# Patient Record
Sex: Male | Born: 1970
Health system: Southern US, Community
[De-identification: ages and names within clinical notes are randomized; demographics above are authoritative.]

## PROBLEM LIST (undated history)

## (undated) DIAGNOSIS — M2550 Pain in unspecified joint: Secondary | ICD-10-CM

## (undated) DIAGNOSIS — M1990 Unspecified osteoarthritis, unspecified site: Secondary | ICD-10-CM

## (undated) DIAGNOSIS — G4733 Obstructive sleep apnea (adult) (pediatric): Secondary | ICD-10-CM

## (undated) DIAGNOSIS — Z973 Presence of spectacles and contact lenses: Secondary | ICD-10-CM

## (undated) HISTORY — DX: Presence of spectacles and contact lenses: Z97.3

## (undated) HISTORY — PX: BUNIONECTOMY: SHX129

## (undated) HISTORY — DX: Pain in unspecified joint: M25.50

## (undated) HISTORY — PX: ROTATOR CUFF REPAIR: SHX139

## (undated) HISTORY — DX: Unspecified osteoarthritis, unspecified site: M19.90

## (undated) HISTORY — DX: Obstructive sleep apnea (adult) (pediatric): G47.33

---

## 1992-08-11 HISTORY — PX: TONSILLECTOMY: SHX5217

## 1998-05-21 ENCOUNTER — Other Ambulatory Visit: Admission: RE | Admit: 1998-05-21 | Discharge: 1998-05-21 | Payer: Self-pay | Admitting: Otolaryngology

## 1998-08-11 HISTORY — PX: NASAL SINUS SURGERY: SHX719

## 2004-12-24 ENCOUNTER — Ambulatory Visit (HOSPITAL_BASED_OUTPATIENT_CLINIC_OR_DEPARTMENT_OTHER): Admission: RE | Admit: 2004-12-24 | Discharge: 2004-12-24 | Payer: Self-pay | Admitting: Otolaryngology

## 2004-12-24 ENCOUNTER — Encounter: Payer: Self-pay | Admitting: Pulmonary Disease

## 2004-12-30 ENCOUNTER — Ambulatory Visit: Payer: Self-pay | Admitting: Internal Medicine

## 2009-04-24 ENCOUNTER — Encounter: Payer: Self-pay | Admitting: Pulmonary Disease

## 2009-05-28 ENCOUNTER — Ambulatory Visit: Payer: Self-pay | Admitting: Pulmonary Disease

## 2009-05-28 DIAGNOSIS — G4733 Obstructive sleep apnea (adult) (pediatric): Secondary | ICD-10-CM | POA: Insufficient documentation

## 2009-05-28 DIAGNOSIS — J309 Allergic rhinitis, unspecified: Secondary | ICD-10-CM | POA: Insufficient documentation

## 2009-06-25 ENCOUNTER — Ambulatory Visit: Payer: Self-pay | Admitting: Pulmonary Disease

## 2009-08-11 DIAGNOSIS — G4733 Obstructive sleep apnea (adult) (pediatric): Secondary | ICD-10-CM

## 2009-08-11 HISTORY — DX: Obstructive sleep apnea (adult) (pediatric): G47.33

## 2009-12-24 ENCOUNTER — Ambulatory Visit: Payer: Self-pay | Admitting: Pulmonary Disease

## 2010-02-02 ENCOUNTER — Encounter: Payer: Self-pay | Admitting: Pulmonary Disease

## 2010-09-02 ENCOUNTER — Encounter: Payer: Self-pay | Admitting: Family Medicine

## 2010-09-10 NOTE — Assessment & Plan Note (Signed)
Summary: rov for osa   Copy to:  Osborn Coho Primary Provider/Referring Provider:  Amil Amen- Dr. Lynelle Doctor.   CC:  Pt is here for a 6 month f/u appt.  Pt states he is wearing his cpap machine every night.  Approx 4 to 5 hours per night.  Pt states mask feels "loose" and c/o air leaks.  Pt also wonder if pressure may be "too soft." .  History of Present Illness: the pt comes in today for f/u of his know osa.  At the last visit he was supposed to have auto download with advanced to optimize his pressure, and we have not received a report and the pt is unsure if this was ever done.  He is still waking up during the night, but I have told him that his pressure may not be optimized.  He clearly sees a difference btw when he wears and when he doesn't.  He doesn't feel his pressure is adequate, and may be having issues with mask leak.   His weight is down 7 pounds since the last visit.  Current Medications (verified): 1)  Mobic 7.5 Mg Tabs (Meloxicam) .... Take 1 Tablet By Mouth Two Times A Day  Allergies (verified): 1)  ! Penicillin  Review of Systems      See HPI  Vital Signs:  Patient profile:   40 year old male Height:      73.5 inches Weight:      276 pounds BMI:     36.05 O2 Sat:      96 % on Room air Temp:     97.7 degrees F oral Pulse rate:   71 / minute BP sitting:   108 / 60  (left arm) Cuff size:   large  Vitals Entered By: Arman Filter LPN (Dec 24, 2009 2:54 PM)  O2 Flow:  Room air CC: Pt is here for a 6 month f/u appt.  Pt states he is wearing his cpap machine every night.  Approx 4 to 5 hours per night.  Pt states mask feels "loose" and c/o air leaks.  Pt also wonder if pressure may be "too soft."  Comments Medications reviewed with patient Arman Filter LPN  Dec 24, 2009 2:55 PM    Physical Exam  General:  obese male in nad Nose:  no skin breakdown or pressure necrosis from cpap mask Neurologic:  alert and oriented, moves all 4. not  sleepy.   Impression & Recommendations:  Problem # 1:  OBSTRUCTIVE SLEEP APNEA (ICD-327.23) the pt is doing well with cpap wrt compliance, and clearly sees a difference since wearing.  However, he is having some awakenings that are probably due to inadequate pressure.  He never had his pressure optimized.  At this point, will need to optimize pressure and also get dme to check on mask fit.  He also needs to work aggressively on weight loss.  Medications Added to Medication List This Visit: 1)  Mobic 7.5 Mg Tabs (Meloxicam) .... Take 1 tablet by mouth two times a day  Other Orders: Est. Patient Level III ([address]) DME Referral (DME)  Patient Instructions: 1)  will re-optimize your pressure, and let you know the results. 2)  work on weight loss 3)  followup with me in one year if doing well once we optimize your pressure, but sooner if having issues.   Immunization History:  Influenza Immunization History:    Influenza:  historical (05/11/2009)

## 2010-09-10 NOTE — Miscellaneous (Signed)
Summary: auto shows optimal pressure 10cm, but poor compliance  Clinical Lists Changes  Orders: Added new Referral order of DME Referral (DME) - Signed auto shows optimal pressure of 10cm, but very limited compliance. % used days greater than or equal to 4 hrs was only 33%

## 2010-12-20 ENCOUNTER — Encounter: Payer: Self-pay | Admitting: Pulmonary Disease

## 2010-12-24 ENCOUNTER — Ambulatory Visit: Payer: Self-pay | Admitting: Pulmonary Disease

## 2010-12-27 NOTE — Procedures (Signed)
Tony Waller, Tony Waller                  ACCOUNT NO.:  [id number]   MEDICAL RECORD NO.:  [id number]          PATIENT TYPE:  OUT   LOCATION:  SLEEP CENTER                 FACILITY:  [facility]   PHYSICIAN:  Clinton D. Maple Hudson, M.D. DATE OF BIRTH:  08-20-1970   DATE OF STUDY:  12/24/2004                              NOCTURNAL POLYSOMNOGRAM   REFERRING PHYSICIAN:  Dr. Osborn Coho   STUDY DATE:  Dec 24, 2004   INDICATION FOR STUDY:  Hypersomnia with sleep apnea.  Epworth Sleepiness  Score is 10/24, BMI 39, weight 300 pounds.   SLEEP ARCHITECTURE:  Total sleep time 312 minutes with sleep efficiency 74%.  Stage I was 10%, stage II 61%, stages III and IV 11%, REM was 18% of total  sleep time.  Sleep latency 53 minutes, REM latency 178 minutes, awake after  sleep onset 53 minutes, arousal index 34.   RESPIRATORY DATA:  Respiratory disturbance index (RDI, AHI) 63.7 obstructive  events per hour indicating severe obstructive sleep apnea/hypopnea syndrome.  There were 21 obstructive apneas, 1 mixed apnea and 310 hypopneas.  Events  were not positional.  REM RDI 63.  The technician could not use split  protocol for titration reporting that the patient did not have enough sleep  to meet allowed time window initially to permit time for subsequent CPAP  titration.   OXYGEN DATA:  Moderate to loud snoring with oxygen desaturation to a nadir  of 81% on room air.  Mean oxygen saturation through the study was 94% on  room air.   CARDIAC DATA:  Normal sinus rhythm with occasional PVC.   MOVEMENT/PARASOMNIA:  Occasional leg jerk with insignificant effect on  sleep.  Bathroom x1.   IMPRESSION/RECOMMENDATION:  1.  Severe obstructive sleep apnea/hypopnea syndrome, respiratory      disturbance index 63.7 per hour with loud snoring and oxygen      desaturation to 81%.  2.  Consider return for continuous positive airway pressure titration or      evaluate for alternative therapies as appropriate.      CDY/MEDQ  D:  12/29/2004 11:34:44  T:  12/29/2004 13:32:20  Job:  [id number]

## 2013-06-29 ENCOUNTER — Encounter: Payer: Self-pay | Admitting: Podiatry

## 2013-06-29 ENCOUNTER — Ambulatory Visit (INDEPENDENT_AMBULATORY_CARE_PROVIDER_SITE_OTHER): Payer: 59

## 2013-06-29 ENCOUNTER — Ambulatory Visit (INDEPENDENT_AMBULATORY_CARE_PROVIDER_SITE_OTHER): Payer: 59 | Admitting: Podiatry

## 2013-06-29 VITALS — BP 146/81 | HR 72 | Resp 16 | Ht 74.0 in | Wt 276.0 lb

## 2013-06-29 DIAGNOSIS — M21611 Bunion of right foot: Secondary | ICD-10-CM

## 2013-06-29 DIAGNOSIS — M2020 Hallux rigidus, unspecified foot: Secondary | ICD-10-CM

## 2013-06-29 DIAGNOSIS — M21619 Bunion of unspecified foot: Secondary | ICD-10-CM

## 2013-06-29 DIAGNOSIS — M2040 Other hammer toe(s) (acquired), unspecified foot: Secondary | ICD-10-CM

## 2013-06-29 DIAGNOSIS — M2021 Hallux rigidus, right foot: Secondary | ICD-10-CM

## 2013-06-29 DIAGNOSIS — L600 Ingrowing nail: Secondary | ICD-10-CM

## 2013-06-29 MED ORDER — OXYCODONE-ACETAMINOPHEN 10-325 MG PO TABS: 1.0000 | ORAL_TABLET | ORAL | Status: DC | PRN

## 2013-06-29 NOTE — Progress Notes (Signed)
Subjective:     Patient ID: Tony Waller, male   DOB: 11/18/1970, 42 y.o.   MRN: [id number]  Foot Pain   patient points to right foot stating the joint is starting to hurt me more and my toes are giving me trouble wearing shoe gear. Patient states the left foot is doing fine and also states the right hallux has an ingrown toenail which is becoming increasingly sore. He's known he's needed surgery on this and he now has the opportunity to have this fixed   Review of Systems  All other systems reviewed and are negative.       Objective:   Physical Exam  Nursing note and vitals reviewed. Constitutional: He is oriented to person, place, and time.  Cardiovascular: Intact distal pulses.   Musculoskeletal: Normal range of motion.  Neurological: He is oriented to person, place, and time.   neurovascular status found to be intact with normal muscle strength and range of motion of all joints. Incision sites on left have healed very well with good range of motion first MPJ and good digital correction of the lesser digits right foot shows significant restriction range of motion first MPJ with digital deformity of the second third fourth and fifth toe. I also noted ingrown toenail right hallux medial border     Assessment:     Hallux limitus rigidus deformity right hammertoe deformity 2345 right. Ingrown toenail right hallux medial border    Plan:     X-ray of right foot and reviewed treatment options. Do to long-standing problem and worsening of symptoms I have recommended biplanar [name] with removal of bone spurs digital fusion digits 234 and arthroplasty digit 5 right foot patient wants to have this surgery done and at this time signs consent form for correction after extensive review we will fix his ingrown toenail 1-2 weeks after the surgery as I don't want to mix the to try to surgery and preoperative instructions given to him for his surgery next week

## 2013-06-29 NOTE — Patient Instructions (Addendum)
Bunion You have a bunion deformity of the feet. This is more common in women. It tends to be an inherited problem. Symptoms can include pain, swelling, and deformity around the great toe. Numbness and tingling may also be present. Your symptoms are often worsened by wearing shoes that cause pressure on the bunion. Changing the type of shoes you wear helps reduce symptoms. A wide shoe decreases pressure on the bunion. An arch support may be used if you have flat feet. Avoid shoes with heels higher than two inches. This puts more pressure on the bunion. X-rays may be helpful in evaluating the severity of the problem. Other foot problems often seen with bunions include corns, calluses, and hammer toes. If the deformity or pain is severe, surgical treatment may be necessary. Keep off your painful foot as much as possible until the pain is relieved. Call your caregiver if your symptoms are worse.  SEEK IMMEDIATE MEDICAL CARE IF:  You have increased redness, pain, swelling, or other symptoms of infection. Document Released: 07/28/2005 Document Revised: 10/20/2011 Document Reviewed: 01/25/2007 [facility] Patient Information 2014 [place], [place]. Pre-Operative Instructions  Congratulations, you have decided to take an important step to improving your quality of life.  You can be assured that the doctors of [facility] will be with you every step of the way.  1. Plan to be at the surgery center/hospital at least 1 (one) hour prior to your scheduled time unless otherwise directed by the surgical center/hospital staff.  You must have a responsible adult accompany you, remain during the surgery and drive you home.  Make sure you have directions to the surgical center/hospital and know how to get there on time. 2. For hospital based surgery you will need to obtain a history and physical form from your family physician within 1 month prior to the date of surgery- we will give you a form for you primary physician.   3. We make every effort to accommodate the date you request for surgery.  There are however, times where surgery dates or times have to be moved.  We will contact you as soon as possible if a change in schedule is required.   4. No Aspirin/Ibuprofen for one week before surgery.  If you are on aspirin, any non-steroidal anti-inflammatory medications (Mobic, Aleve, Ibuprofen) you should stop taking it 7 days prior to your surgery.  You make take Tylenol  For pain prior to surgery.  5. Medications- If you are taking daily heart and blood pressure medications, seizure, reflux, allergy, asthma, anxiety, pain or diabetes medications, make sure the surgery center/hospital is aware before the day of surgery so they may notify you which medications to take or avoid the day of surgery. 6. No food or drink after midnight the night before surgery unless directed otherwise by surgical center/hospital staff. 7. No alcoholic beverages 24 hours prior to surgery.  No smoking 24 hours prior to or 24 hours after surgery. 8. Wear loose pants or shorts- loose enough to fit over bandages, boots, and casts. 9. No slip on shoes, sneakers are best. 10. Bring your boot with you to the surgery center/hospital.  Also bring crutches or a walker if your physician has prescribed it for you.  If you do not have this equipment, it will be provided for you after surgery. 11. If you have not been contracted by the surgery center/hospital by the day before your surgery, call to confirm the date and time of your surgery. 12. Leave-time from  work may vary depending on the type of surgery you have.  Appropriate arrangements should be made prior to surgery with your employer. 13. Prescriptions will be provided immediately following surgery by your doctor.  Have these filled as soon as possible after surgery and take the medication as directed. 14. Remove nail polish on the operative foot. 15. Wash the night before surgery.  The night before  surgery wash the foot and leg well with the antibacterial soap provided and water paying special attention to beneath the toenails and in between the toes.  Rinse thoroughly with water and dry well with a towel.  Perform this wash unless told not to do so by your physician.  Enclosed: 1 Ice pack (please put in freezer the night before surgery)   1 Hibiclens skin cleaner   Pre-op Instructions  If you have any questions regarding the instructions, do not hesitate to call our office.  [place]: [address] [place], [place] [address] [phone]  [place]: [address] [place], [place] [address] [phone]  : [address], [place] [address] [phone]  Dr. Santiago Bumpers DPM, Dr. Cristie Hem DPM Dr. Alvan Dame DPM, Dr. Ardelle Anton DPM, Dr. Marlowe Aschoff DPM

## 2013-07-05 ENCOUNTER — Encounter: Payer: Self-pay | Admitting: Podiatry

## 2013-07-05 DIAGNOSIS — M2040 Other hammer toe(s) (acquired), unspecified foot: Secondary | ICD-10-CM

## 2013-07-05 DIAGNOSIS — M2010 Hallux valgus (acquired), unspecified foot: Secondary | ICD-10-CM

## 2013-07-11 ENCOUNTER — Ambulatory Visit (INDEPENDENT_AMBULATORY_CARE_PROVIDER_SITE_OTHER): Payer: 59

## 2013-07-11 ENCOUNTER — Ambulatory Visit (INDEPENDENT_AMBULATORY_CARE_PROVIDER_SITE_OTHER): Payer: 59 | Admitting: Podiatry

## 2013-07-11 ENCOUNTER — Encounter: Payer: Self-pay | Admitting: Podiatry

## 2013-07-11 VITALS — BP 125/78 | HR 76 | Resp 12

## 2013-07-11 DIAGNOSIS — M2040 Other hammer toe(s) (acquired), unspecified foot: Secondary | ICD-10-CM

## 2013-07-11 DIAGNOSIS — M2021 Hallux rigidus, right foot: Secondary | ICD-10-CM

## 2013-07-11 DIAGNOSIS — Z9889 Other specified postprocedural states: Secondary | ICD-10-CM

## 2013-07-11 DIAGNOSIS — M2020 Hallux rigidus, unspecified foot: Secondary | ICD-10-CM

## 2013-07-11 DIAGNOSIS — L600 Ingrowing nail: Secondary | ICD-10-CM

## 2013-07-11 NOTE — Patient Instructions (Signed)
Continue to keep dry until I see you back next week

## 2013-07-11 NOTE — Progress Notes (Signed)
Subjective:     Patient ID: Tony Waller, male   DOB: 26-Jul-1971, 42 y.o.   MRN: [id number]  HPI patient states I'm doing well after my surgery and able to walk without discomfort 6 days after having forefoot surgery right   Review of Systems  All other systems reviewed and are negative.       Objective:   Physical Exam  Nursing note and vitals reviewed. Constitutional: He is oriented to person, place, and time.  Cardiovascular: Intact distal pulses.   Musculoskeletal: Normal range of motion.  Neurological: He is oriented to person, place, and time.  Skin: Skin is warm.   patient's right foot looks good with incision site healing well and pins intact second third and fourth toes with good alignment of the digits. Stitches are intact and negative Homans sign is noted     Assessment:     Patient is doing well postoperative forefoot surgery right    Plan:     Reviewed x-rays and reapplied sterile dressings. Dispense Darco shoe and Ace wrap and instructed on continued immobilization and dressing until I see back next week. Ingrown toenail will be fixed at next visit

## 2013-07-14 ENCOUNTER — Telehealth: Payer: Self-pay | Admitting: *Deleted

## 2013-07-14 NOTE — Telephone Encounter (Signed)
Pt states the dressing from yesterday came off.  Dr Charlsie Merles states may cover surgical sites with gauze pads and ace wrap.  Left orders on [phone].

## 2013-07-21 ENCOUNTER — Encounter: Payer: Self-pay | Admitting: Podiatry

## 2013-07-21 ENCOUNTER — Ambulatory Visit (INDEPENDENT_AMBULATORY_CARE_PROVIDER_SITE_OTHER): Payer: 59 | Admitting: Podiatry

## 2013-07-21 VITALS — BP 130/78 | HR 76 | Resp 16

## 2013-07-21 DIAGNOSIS — L600 Ingrowing nail: Secondary | ICD-10-CM

## 2013-07-21 DIAGNOSIS — M2040 Other hammer toe(s) (acquired), unspecified foot: Secondary | ICD-10-CM

## 2013-07-21 NOTE — Patient Instructions (Signed)
ANTIBACTERIAL SOAP INSTRUCTIONS  THE DAY AFTER PROCEDURE  Please follow the instructions your doctor has marked.   Shower as usual. Before getting out, place a drop of antibacterial liquid soap (Dial) on a wet, clean washcloth.  Gently wipe washcloth over affected area.  Afterward, rinse the area with warm water.  Blot the area dry with a soft cloth and cover with antibiotic ointment (neosporin, polysporin, bacitracin) and band aid or gauze and tape

## 2013-07-24 NOTE — Progress Notes (Signed)
Subjective:     Patient ID: Tony Waller, male   DOB: [birth year], 42 y.o.   MRN: [id number]  HPI patient states that I doing well with my surgery with my pins in place and not too much swelling. States he is able to walk well but would like to get this ingrown toenail fixed   Review of Systems     Objective:   Physical Exam Neurovascular status intact with no health history changes noted and incurvated medial border right hallux that is very painful when pressed. Surgical site right are healing well with all incision sites looking good and good alignment noted with good pin positions and range of motion of the first MPJ    Assessment:     Healing well from surgery with chronic ingrown toenail right hallux    Plan:     Reviewed condition and discussed with patient. At this point I have recommended correction of the ingrown toenail and patient wants procedure done I infiltrated 60 mg Xylocaine Marcaine mixture remove corner exposed matrix and applied chemical phenol followed by alcohol 3 applications of phenol followed by 1 application of alcohol and then sterile dressing application. Instructions on soaks given to patient and also stitch removal accomplished with wound edges coapted well and digital splint given to patient

## 2013-08-10 ENCOUNTER — Ambulatory Visit (INDEPENDENT_AMBULATORY_CARE_PROVIDER_SITE_OTHER): Payer: 59

## 2013-08-10 ENCOUNTER — Encounter: Payer: Self-pay | Admitting: Podiatry

## 2013-08-10 ENCOUNTER — Ambulatory Visit (INDEPENDENT_AMBULATORY_CARE_PROVIDER_SITE_OTHER): Payer: 59 | Admitting: Podiatry

## 2013-08-10 ENCOUNTER — Ambulatory Visit: Payer: 59

## 2013-08-10 VITALS — BP 134/88 | HR 67 | Resp 16

## 2013-08-10 DIAGNOSIS — M2010 Hallux valgus (acquired), unspecified foot: Secondary | ICD-10-CM

## 2013-08-10 DIAGNOSIS — M2040 Other hammer toe(s) (acquired), unspecified foot: Secondary | ICD-10-CM

## 2013-08-10 DIAGNOSIS — Z9889 Other specified postprocedural states: Secondary | ICD-10-CM

## 2013-08-10 NOTE — Progress Notes (Signed)
Subjective:     Patient ID: Tony Waller, male   DOB: 02-10-1971, 42 y.o.   MRN: [id number]  HPI patient states I'm doing well with my pins intact and I'm walking with minimal discomfort. 5 weeks after foot surgery right   Review of Systems     Objective:   Physical Exam Neurovascular status intact negative Homans sign noted with good alignment of the lesser digits and pins intact with good range of motion first MPJ    Assessment:     Progressing well and at this time amaking good progress with foot    Plan:     Removed pins and applied sterile dressings and x-ray foot and advised him gradually return to saw shoe gear and importance range of motion first MPJ with gradual return to tennis shoe. Reappoint 4 weeks and less needed earlier

## 2013-08-22 NOTE — Progress Notes (Signed)
1) Austin bunionectomy right foot  2) Hammer toe 2nd, 3rd, 4th, and 5th toes right foot

## 2013-09-07 ENCOUNTER — Encounter: Payer: 59 | Admitting: Podiatry

## 2013-09-14 ENCOUNTER — Encounter: Payer: 59 | Admitting: Podiatry

## 2013-11-03 ENCOUNTER — Encounter: Payer: Self-pay | Admitting: Podiatry

## 2013-11-03 ENCOUNTER — Ambulatory Visit (INDEPENDENT_AMBULATORY_CARE_PROVIDER_SITE_OTHER): Payer: 59 | Admitting: Podiatry

## 2013-11-03 ENCOUNTER — Telehealth: Payer: Self-pay | Admitting: *Deleted

## 2013-11-03 ENCOUNTER — Ambulatory Visit (INDEPENDENT_AMBULATORY_CARE_PROVIDER_SITE_OTHER): Payer: 59

## 2013-11-03 VITALS — BP 138/86 | HR 67 | Resp 16

## 2013-11-03 DIAGNOSIS — Z9889 Other specified postprocedural states: Secondary | ICD-10-CM

## 2013-11-03 DIAGNOSIS — M779 Enthesopathy, unspecified: Secondary | ICD-10-CM

## 2013-11-03 DIAGNOSIS — M2020 Hallux rigidus, unspecified foot: Secondary | ICD-10-CM

## 2013-11-03 MED ORDER — TRIAMCINOLONE ACETONIDE 10 MG/ML IJ SUSP: 10.0000 mg | Freq: Once | INTRAMUSCULAR | Status: DC

## 2013-11-03 MED ORDER — DICLOFENAC SODIUM 75 MG PO TBEC: 75.0000 mg | DELAYED_RELEASE_TABLET | Freq: Two times a day (BID) | ORAL | Status: DC

## 2013-11-03 NOTE — Telephone Encounter (Signed)
I'm having a lot of pain in my right foot.  I had surgery in November  Having a lot of pain underneath my big toe.  I can't get an appointment with him until 11/14/13 because he doesn't have any openings.  I need something for pain in the meantime.   At times I can't put weight on it.  I tried Ibuprofen, it's not helping.

## 2013-11-03 NOTE — Telephone Encounter (Signed)
I informed him that Dr. Paulla Dolly wants to see him today at 1:30pm.

## 2013-11-04 NOTE — Progress Notes (Signed)
Subjective:     Patient ID: Tony Waller, male   DOB: Jul 04, 1971, 43 y.o.   MRN: [id number]  HPI patient states I was doing fine and then I started to get pain underneath my first metatarsal head and the last several weeks. States that it is not debilitating but he wanted to get it checked   Review of Systems     Objective:   Physical Exam Neurovascular status intact with discomfort in the plantar sesamoidal complex right both tibial and fibular with minimal edema but structural discomfort. Mild reduction of range of motion of first MPJ but no significant crepitus noted    Assessment:     May be inflammatory sesamoiditis or injury to the first metatarsal head right    Plan:     X-rays reviewed and today I did a careful plantar injection 3 mg Kenalog 5 mg Xylocaine Marcaine mixture and instructed on anti-inflammatories. If this does not get better I want to see him back

## 2013-11-14 ENCOUNTER — Ambulatory Visit: Payer: 59 | Admitting: Podiatry

## 2013-11-24 ENCOUNTER — Ambulatory Visit: Payer: 59 | Admitting: Podiatry

## 2014-02-15 ENCOUNTER — Ambulatory Visit (INDEPENDENT_AMBULATORY_CARE_PROVIDER_SITE_OTHER): Payer: 59 | Admitting: Podiatrist

## 2014-02-15 ENCOUNTER — Ambulatory Visit (INDEPENDENT_AMBULATORY_CARE_PROVIDER_SITE_OTHER): Payer: 59

## 2014-02-15 ENCOUNTER — Encounter: Payer: Self-pay | Admitting: Podiatrist

## 2014-02-15 VITALS — BP 123/83 | HR 100 | Resp 18

## 2014-02-15 DIAGNOSIS — Z09 Encounter for follow-up examination after completed treatment for conditions other than malignant neoplasm: Secondary | ICD-10-CM

## 2014-02-15 DIAGNOSIS — Z9889 Other specified postprocedural states: Secondary | ICD-10-CM

## 2014-02-15 DIAGNOSIS — Z967 Presence of other bone and tendon implants: Secondary | ICD-10-CM

## 2014-02-15 MED ORDER — HYDROCODONE-ACETAMINOPHEN 5-325 MG PO TABS: 1.0000 | ORAL_TABLET | ORAL | Status: DC | PRN

## 2014-02-15 NOTE — Progress Notes (Signed)
Right foot has some pain and sore and tender and surgery was done in nov 2014  Patient presents today for pain right foot at the area of a previous bunion surgery. He states he recently began to have some discomfort in the area and wonders if he can feel the end of the pin. He states he's having to wear different shoes to keep the pressure off of the foot.  Objective: Neurovascular status is intact. A palpable pin is present and a slightly tenting the skin and is in area of discomfort. X-rays confirm the pin is backing out very slightly.  Assessment: Painful hardware right  Plan: Recommended he follow up with Tony Waller to have the pin removed and appointment was made for him for this procedure. I wrote him a prescription for hydrocodone to use sparingly prior to the procedure and for afterwards as well. He has any concerns or problems he will call.

## 2014-03-02 ENCOUNTER — Ambulatory Visit (INDEPENDENT_AMBULATORY_CARE_PROVIDER_SITE_OTHER): Payer: 59 | Admitting: Podiatry

## 2014-03-02 ENCOUNTER — Encounter: Payer: Self-pay | Admitting: Podiatry

## 2014-03-02 DIAGNOSIS — Z472 Encounter for removal of internal fixation device: Secondary | ICD-10-CM

## 2014-03-04 NOTE — Progress Notes (Signed)
Subjective:     Patient ID: Tony Waller, male   DOB: 10-Dec-1970, 43 y.o.   MRN: [id number]  HPI patient presents with pain in the right first incision site where we had place pin that has become symptomatic and painful   Review of Systems     Objective:   Physical Exam Neurovascular status intact with prominence of the first metatarsal shaft directly over where pin was placed with pain and deformity noted    Assessment:     Abnormal pin position first metatarsal right that's painful    Plan:     Advised on removal and allowed him to read consent form for correction and explained in the procedure and the risk that occurs. This will be done in the office and he is scheduled next week and signs consent form and had padding applied around the area to get him short-term relief

## 2014-03-15 ENCOUNTER — Ambulatory Visit (INDEPENDENT_AMBULATORY_CARE_PROVIDER_SITE_OTHER): Payer: 59 | Admitting: Podiatry

## 2014-03-15 DIAGNOSIS — Z472 Encounter for removal of internal fixation device: Secondary | ICD-10-CM

## 2014-03-15 NOTE — Progress Notes (Signed)
Subjective:     Patient ID: Tony Waller, male   DOB: [birth year], 43 y.o.   MRN: [id number]  HPI patient presents for removal of deep fixation right first metatarsal   Review of Systems     Objective:   Physical Exam Neurovascular status intact with no health history changes    Assessment:     Abnormal pin position right first metatarsal with pain    Plan:     Reviewed condition with patient and advised removal. I infiltrated the right first metatarsal with 60 mg Xylocaine Marcaine mixture I then placed the patient and surgical chair and applied sterile prep to the right foot. I inflated tourniquet to 250 mmHg and I then went ahead and prepped the foot. I then did a 3 cm incision centered over the pin which is prominent to be down to subcutaneous tissue with hemostasis being acquired as necessary. I further took it to capsule and made a small capsular incision and identified the abnormal pin position. I removed the pin with sharp and blunt dissection in toto and flushed the wound. Sutured with 5-0 nylon and applied sterile dressing and let the tourniquet released noting that all 5 toes had good warmth and color. Patient was taken from the OR in satisfactory condition and will be seen back in 2 weeks earlier if any issues should occur

## 2014-03-15 NOTE — Patient Instructions (Signed)
After Surgery Instructions   1) If you are recuperating from surgery anywhere other than home, please be sure to leave [place] the number where you can be reached.  2) Go directly home and rest.  3) Keep the operated foot(feet) elevated six inches above the hip when sitting or lying down. This will help control swelling and pain.  4) Support the elevated foot and leg with pillows. DO NOT PLACE PILLOWS UNDER THE KNEE.  5) DO NOT REMOVE or get your bandages WET, unless you were given different instructions by your doctor to do so. This increases the risk of infection.  6) Wear your surgical shoe or surgical boot at all times when you are up on your feet.  7) A limited amount of pain and swelling may occur. The skin may take on a bruised appearance. DO NOT BE ALARMED, THIS IS NORMAL.  8) For slight pain and swelling, apply an ice pack directly over the bandages for 15 minutes only out of each hour of the day. Continue until seen in the office for your first post op visit. DON NOT     APPLY ANY FORM OF HEAT TO THE AREA.  9) Have prescriptions filled immediately and take as directed.  10) Drink lots of liquids, water and juice to stay hydrated.  11) CALL IMMEDIATELY IF:  *Bleeding continues until the following day of surgery  *Pain increases and/or does not respond to medication  *Bandages or cast appears to tight  *If your bandage gets wet  *Trip, fall or stump your surgical foot  *If your temperature goes above 101  *If you have ANY questions at all  [place]. ADHERING TO THESE INSTRUCTIONS WILL OFFER YOU THE MOST COMPLETE RESULTS

## 2014-03-16 ENCOUNTER — Ambulatory Visit: Payer: 59 | Admitting: Podiatry

## 2014-03-29 ENCOUNTER — Encounter: Payer: 59 | Admitting: Podiatry

## 2014-03-29 ENCOUNTER — Ambulatory Visit: Payer: Self-pay

## 2014-03-30 ENCOUNTER — Ambulatory Visit (INDEPENDENT_AMBULATORY_CARE_PROVIDER_SITE_OTHER): Payer: 59

## 2014-03-30 ENCOUNTER — Encounter: Payer: Self-pay | Admitting: Podiatry

## 2014-03-30 ENCOUNTER — Ambulatory Visit (INDEPENDENT_AMBULATORY_CARE_PROVIDER_SITE_OTHER): Payer: 59 | Admitting: Podiatry

## 2014-03-30 VITALS — BP 113/73 | HR 72 | Resp 16

## 2014-03-30 DIAGNOSIS — Z472 Encounter for removal of internal fixation device: Secondary | ICD-10-CM

## 2014-03-30 NOTE — Progress Notes (Signed)
Subjective:     Patient ID: Tony Waller, male   DOB: April 05, 1971, 43 y.o.   MRN: [id number]  HPI patient states my foot is feeling much better with no pain   Review of Systems     Objective:   Physical Exam Neurovascular status intact well-healed surgical site right first metatarsal    Assessment:     Doing well from removal of pin right    Plan:     Final x-ray taken and advised him gradual increase in activities as tolerated

## 2014-05-29 NOTE — Progress Notes (Signed)
This encounter was created in error - please disregard.

## 2015-12-21 ENCOUNTER — Other Ambulatory Visit (HOSPITAL_COMMUNITY): Payer: Self-pay | Admitting: Family Medicine

## 2015-12-21 DIAGNOSIS — E349 Endocrine disorder, unspecified: Secondary | ICD-10-CM

## 2015-12-21 DIAGNOSIS — R7989 Other specified abnormal findings of blood chemistry: Secondary | ICD-10-CM

## 2015-12-26 ENCOUNTER — Encounter (HOSPITAL_COMMUNITY): Payer: 59

## 2016-01-08 ENCOUNTER — Encounter (HOSPITAL_COMMUNITY)
Admission: RE | Admit: 2016-01-08 | Discharge: 2016-01-08 | Disposition: A | Discharge status: Home / Self Care | Payer: 59 | Source: Ambulatory Visit | Attending: Family Medicine | Admitting: Family Medicine

## 2016-01-08 DIAGNOSIS — E213 Hyperparathyroidism, unspecified: Secondary | ICD-10-CM | POA: Diagnosis not present

## 2016-01-08 DIAGNOSIS — E349 Endocrine disorder, unspecified: Secondary | ICD-10-CM

## 2016-01-08 IMAGING — NM NM PARATHYROID W/ SPECT
5 series · 20 of 20 positions shown · non-contrast
Comparison: None.

CLINICAL DATA: Hyperparathyroidism.  Elevated PTH.

EXAM:
NM PARATHYROID SCINTIGRAPHY AND SPECT IMAGING
TECHNIQUE: Following intravenous administration of radiopharmaceutical, early
and 2-hour delayed planar images were obtained in the anterior
projection. Delayed triplanar SPECT images were also obtained at 2
hours.
RADIOPHARMACEUTICALS:  26.3 mCi [MD] Sestamibi IV

[Series 1: spect - (id)_(id)_cor · 8.3mm · 8.28mm/px · 6 of 64 frames shown]
[frame 6/64]
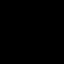
[frame 16/64]
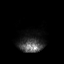
[frame 27/64]
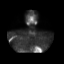
[frame 38/64]
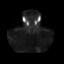
[frame 48/64]
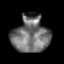
[frame 59/64]
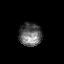

[Series 1: 2 hr ant · 4.14mm/px · 1 of 1 slices shown]
[im 1/1]
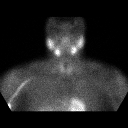

[Series 1: spect - (id)_(id)_tra · 8.3mm · 8.28mm/px · 6 of 64 frames shown]
[frame 6/64]
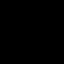
[frame 16/64]
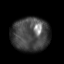
[frame 27/64]
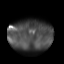
[frame 38/64]
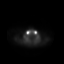
[frame 48/64]
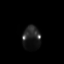
[frame 59/64]
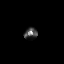

[Series 1: 15 min ant · 2.07mm/px · 1 of 1 slices shown]
[im 1/1]
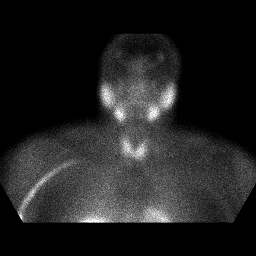

[Series 2: spect parathyroid · 8.28mm/px · 6 of 64 frames shown]
[frame 6/64]
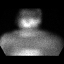
[frame 16/64]
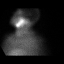
[frame 27/64]
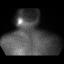
[frame 38/64]
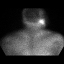
[frame 48/64]
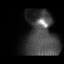
[frame 59/64]
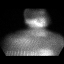

[20 of 20 positions shown; findings below may reference images not displayed]

FINDINGS: On the 2 hour delayed planar imaging. No focal activity within
thyroid bed above background thyroid tissue uptake.

SPECT imaging of the neck fails to localize focal activity above
background thyroid gland activity.
IMPRESSION: No focal activity within the thyroid bed or chest to localize
parathyroid adenoma.

## 2016-01-08 MED ORDER — TECHNETIUM TC 99M SESTAMIBI GENERIC - CARDIOLITE
25.0000 | Freq: Once | INTRAVENOUS | Status: AC | PRN
  Administered 2016-01-08: 25 via INTRAVENOUS

## 2017-09-04 ENCOUNTER — Ambulatory Visit: Payer: 59 | Admitting: Podiatry

## 2017-09-04 ENCOUNTER — Encounter: Payer: Self-pay | Admitting: Podiatry

## 2017-09-04 ENCOUNTER — Ambulatory Visit (INDEPENDENT_AMBULATORY_CARE_PROVIDER_SITE_OTHER): Payer: 59

## 2017-09-04 DIAGNOSIS — M779 Enthesopathy, unspecified: Secondary | ICD-10-CM

## 2017-09-04 DIAGNOSIS — M2010 Hallux valgus (acquired), unspecified foot: Secondary | ICD-10-CM | POA: Diagnosis not present

## 2017-09-04 MED ORDER — TRIAMCINOLONE ACETONIDE 10 MG/ML IJ SUSP
10.0000 mg | Freq: Once | INTRAMUSCULAR | Status: AC
  Administered 2017-09-04: 10 mg

## 2017-09-04 NOTE — Progress Notes (Signed)
Subjective:   Patient ID: Tony Waller, male   DOB: 47 y.o.   MRN: [id number]   HPI patient presents stating he has had a lot of pain in the outside of his right foot and states it has been hurting him for about 4 months and is very pleased with his previous surgery.  Patient states that he is tried wider shoes and patient states he does not smoke and likes to be active    Review of Systems  All other systems reviewed and are negative.       Objective:  Physical Exam  Constitutional: He appears well-developed and well-nourished.  Cardiovascular: Intact distal pulses.  Pulmonary/Chest: Effort normal.  Musculoskeletal: Normal range of motion.  Neurological: He is alert.  Skin: Skin is warm.  Nursing note and vitals reviewed.   Neurovascular status found to be intact with muscle strength adequate range of motion within normal limits with patient noted to have inflammation and pain around the fifth metatarsal right.  Patient has well-healed surgical site bilateral and has excellent alignment with good range of motion first MPJ     Assessment:  Inflammatory tailor's bunion deformity right with fluid buildup and capsulitis with well-healing surgical site bilateral     Plan:  H&P conditions reviewed x-rays reviewed.  Injection of the fifth MPJ right accomplished 3 mg dexamethasone Kenalog 5 mg Xylocaine and advised on wider shoes and soaks.  Reappoint to recheck  X-rays indicate that there is good structural correction first metatarsal bilateral with joint congruous no indications of reoccurrence of arthritis

## 2017-09-04 NOTE — Patient Instructions (Signed)

## 2018-05-20 ENCOUNTER — Ambulatory Visit: Payer: Self-pay | Admitting: Medical

## 2018-05-24 ENCOUNTER — Ambulatory Visit: Payer: 59 | Admitting: Podiatry

## 2019-01-28 ENCOUNTER — Other Ambulatory Visit: Payer: Self-pay

## 2019-01-28 ENCOUNTER — Encounter: Payer: Self-pay | Admitting: Family Medicine

## 2019-01-28 ENCOUNTER — Ambulatory Visit (INDEPENDENT_AMBULATORY_CARE_PROVIDER_SITE_OTHER): Payer: 59 | Admitting: Family Medicine

## 2019-01-28 DIAGNOSIS — M1711 Unilateral primary osteoarthritis, right knee: Secondary | ICD-10-CM | POA: Diagnosis not present

## 2019-01-28 DIAGNOSIS — M1712 Unilateral primary osteoarthritis, left knee: Secondary | ICD-10-CM | POA: Diagnosis not present

## 2019-01-28 NOTE — Progress Notes (Signed)
Office Visit Note   Patient: Tony Waller           Date of Birth: [birth year]           MRN: [id number] Visit Date: 01/28/2019 Requested by: Aura Dials, [place],  [place] [address] PCP: Aura Dials, MD  Subjective: Chief Complaint  Patient presents with  . Left Knee - Pain    Knee "feels unstable." Gives way with walking - no falls. +popping bilateral knees. Has had cortisone injections in the past, which have helped both knees.    HPI: He is here with left foot and the right knee pain.  Longstanding problems with his knees, diagnosed with osteoarthritis at another facility.  Periodically gets cortisone injections and occasionally Visco supplementation.  Cortisone usually works best for him.  Recently his left knee has been hurting quite a bit and feeling unstable.  No new injury.  He has never had surgery.  He has never had an MRI scan.              ROS: No fevers or chills.  No history of diabetes.  He is otherwise in good health except for obesity.  All other systems were reviewed and are negative.  Objective: Vital Signs: There were no vitals taken for this visit.  Physical Exam:  General:  Alert and oriented, in no acute distress. Pulm:  Breathing unlabored. Psy:  Normal mood, congruent affect. Skin: No rash on the skin. Knees: Trace bilateral effusions with 1+ patellofemoral crepitus on the right and 2+ on the left.  Full extension and flexion of 125 degrees bilaterally.  Left knee is tender on the lateral joint line with pain and a palpable click with McMurray's.  Ligaments feel stable on both knees.  Imaging: None today.  I did briefly imaged the posterior left knee with ultrasound but did not record the images.  I did not see a popliteal cyst.  Assessment & Plan: 1.  Bilateral chronic knee pain with history of osteoarthritis -Discussed with patient, he wants to try cortisone injections today.  Gel injections if he only gets short-term relief.  Could  contemplate left knee MRI scan if he keeps having instability feelings.     Procedures: Bilateral knee steroid injections: After sterile prep with Betadine, injected 5 cc 1% lidocaine without epinephrine and 40 mg methylprednisolone from lateral midpatellar approach.    PMFS History: Patient Active Problem List   Diagnosis Date Noted  . OBSTRUCTIVE SLEEP APNEA 05/28/2009  . ALLERGIC RHINITIS 05/28/2009   Past Medical History:  Diagnosis Date  . ALLERGIC RHINITIS   . OSA (obstructive sleep apnea)     Family History  Problem Relation Age of Onset  . Allergies Mother   . Heart disease Paternal Grandfather   . Lung cancer Paternal Grandfather   . Brain cancer Maternal Uncle     Past Surgical History:  Procedure Laterality Date  . BUNIONECTOMY Left   . NASAL SINUS SURGERY  2000   for deviated septum and turbonate reduction  . TONSILLECTOMY  1994   Social History   Occupational History  . Occupation: Scientist, product/process development: [employer]  Tobacco Use  . Smoking status: Former Smoker    Years: 9.00    Types: Cigars  . Smokeless tobacco: Never Used  Substance and Sexual Activity  . Alcohol use: Yes  . Drug use: Not on file  . Sexual activity: Not on file

## 2019-01-31 ENCOUNTER — Encounter: Payer: Self-pay | Admitting: Family Medicine

## 2019-01-31 NOTE — Progress Notes (Signed)
Eunice Blase, MD  Mal Misty, RMA        Please request approval for bilateral knee gel injections.

## 2019-02-04 NOTE — Progress Notes (Signed)
Printed demographics for submission.

## 2019-02-10 ENCOUNTER — Telehealth: Payer: Self-pay

## 2019-02-10 NOTE — Telephone Encounter (Signed)
Submitted VOB for Gel-One, bilateral knee.

## 2019-02-24 ENCOUNTER — Telehealth: Payer: Self-pay

## 2019-02-24 NOTE — Telephone Encounter (Signed)
PA required for Gel-One, bilateral knee. Faxed completed PA form to [facility] at [phone].

## 2019-03-04 ENCOUNTER — Telehealth: Payer: Self-pay

## 2019-03-04 NOTE — Telephone Encounter (Signed)
Received form from [facility] stating that Gel-One is not a preferred product.  Preferred products are Orthovisc, Synvisc, and SynviscOne.    Submitted for SynviscOne, bilateral knee.

## 2019-03-08 ENCOUNTER — Ambulatory Visit (INDEPENDENT_AMBULATORY_CARE_PROVIDER_SITE_OTHER): Payer: 59 | Admitting: Family Medicine

## 2019-03-08 ENCOUNTER — Encounter: Payer: Self-pay | Admitting: Family Medicine

## 2019-03-08 ENCOUNTER — Other Ambulatory Visit: Payer: Self-pay

## 2019-03-08 DIAGNOSIS — M2352 Chronic instability of knee, left knee: Secondary | ICD-10-CM | POA: Diagnosis not present

## 2019-03-08 NOTE — Progress Notes (Signed)
   Office Visit Note   Patient: Tony Waller           Date of Birth: [birth year]           MRN: [id number] Visit Date: 03/08/2019 Requested by: Aura Dials, [place],  [place] [address] PCP: Aura Dials, MD  Subjective: Chief Complaint  Patient presents with  . Left Knee - Pain    Cortisone injection 01/28/2019 did help until about 10 days ago. Has been working with a Clinical research associate. With some of the exercises, the knee is feeling unstable again.    HPI: He is here with persistent left knee instability.  Cortisone shot helped with his pain, but the knee continues to feel wobbly when he goes downhill or it is side to side.  He is frustrated by his symptoms.  He is working with an Product/process development scientist for strengthening and he feels like he is getting stronger, but the instability symptoms are very bothersome to him.               ROS: No fevers or chills.  All other systems were reviewed and are negative.  Objective: Vital Signs: There were no vitals taken for this visit.  Physical Exam:  General:  Alert and oriented, in no acute distress. Pulm:  Breathing unlabored. Psy:  Normal mood, congruent affect. Skin: No rash or erythema. Left knee: Trace effusion, no warmth.  1+ patellofemoral crepitus, no pain with patella apprehension or compression.  Lockman's feels solid, no laxity with varus/valgus stress.  He is tender on the posterior medial joint line and has pain but no palpable click with McMurray's.    Imaging: None today.  Assessment & Plan: 1.  Left knee osteoarthritis with instability symptoms, concerning for medial meniscus tear or possibly loose body. -MRI to evaluate, surgical consult if indicated.     Procedures: No procedures performed  No notes on file     PMFS History: Patient Active Problem List   Diagnosis Date Noted  . OBSTRUCTIVE SLEEP APNEA 05/28/2009  . ALLERGIC RHINITIS 05/28/2009   Past Medical History:  Diagnosis Date  . ALLERGIC  RHINITIS   . OSA (obstructive sleep apnea)     Family History  Problem Relation Age of Onset  . Allergies Mother   . Heart disease Paternal Grandfather   . Lung cancer Paternal Grandfather   . Brain cancer Maternal Uncle     Past Surgical History:  Procedure Laterality Date  . BUNIONECTOMY Left   . NASAL SINUS SURGERY  2000   for deviated septum and turbonate reduction  . TONSILLECTOMY  1994   Social History   Occupational History  . Occupation: Scientist, product/process development: Theme park manager  Tobacco Use  . Smoking status: Former Smoker    Years: 9.00    Types: Cigars  . Smokeless tobacco: Never Used  Substance and Sexual Activity  . Alcohol use: Yes  . Drug use: Not on file  . Sexual activity: Not on file

## 2019-03-10 ENCOUNTER — Telehealth: Payer: Self-pay

## 2019-03-10 NOTE — Telephone Encounter (Signed)
PA required for SynviscOne, bilateral knee. Faxed completed PA form to [facility] at [phone].

## 2019-03-25 ENCOUNTER — Telehealth: Payer: Self-pay

## 2019-03-25 NOTE — Telephone Encounter (Signed)
Called and left a VM advising patient to call back to make an appointment with Dr. Junius Roads for gel injection.  Approved for SynviscOne, bilateral knee. [place] Patient will be responsible for 20% OOP. No Co-pay PA required PA Approval# [id number] per Elta Gu[place] with [place] insurance Valid 02/24/2019- 02/24/2020

## 2019-04-04 ENCOUNTER — Encounter: Payer: Self-pay | Admitting: Family Medicine

## 2019-04-04 ENCOUNTER — Ambulatory Visit (INDEPENDENT_AMBULATORY_CARE_PROVIDER_SITE_OTHER): Payer: 59 | Admitting: Family Medicine

## 2019-04-04 DIAGNOSIS — M1712 Unilateral primary osteoarthritis, left knee: Secondary | ICD-10-CM | POA: Diagnosis not present

## 2019-04-04 DIAGNOSIS — M1711 Unilateral primary osteoarthritis, right knee: Secondary | ICD-10-CM

## 2019-04-04 NOTE — Progress Notes (Signed)
Subjective: He is here for planned bilateral Synvisc 1 injections for knee osteoarthritis.  He has done well with these in the past.  Objective: Trace effusion in both knees with no warmth erythema.  Procedure: Ultrasound-guided bilateral knee injections: After sterile prep with Betadine, injected 3 cc 1% lidocaine without epinephrine then Synvisc-1 using a 22-gauge needle from lateral approach in the right knee and medial approach in the left knee.  Ultrasound was used to confirm intra-articular placement to ensure successful result.  Injectate was seen filling the joint capsule in both knees.

## 2019-04-12 ENCOUNTER — Other Ambulatory Visit: Payer: Self-pay

## 2019-04-12 ENCOUNTER — Ambulatory Visit
Admission: RE | Admit: 2019-04-12 | Discharge: 2019-04-12 | Disposition: A | Discharge status: Home / Self Care | Payer: 59 | Source: Ambulatory Visit | Attending: Family Medicine | Admitting: Family Medicine

## 2019-04-12 ENCOUNTER — Telehealth: Payer: Self-pay | Admitting: Family Medicine

## 2019-04-12 DIAGNOSIS — M2352 Chronic instability of knee, left knee: Secondary | ICD-10-CM

## 2019-04-12 IMAGING — MR MR KNEE*L* W/O CM
4 of 6 series · 22 of 40 positions shown · non-contrast
Comparison: None.

CLINICAL DATA: Knee instability, left knee pain and instability

EXAM:
MRI OF THE LEFT KNEE WITHOUT CONTRAST
TECHNIQUE: Multiplanar, multisequence MR imaging of the knee was performed. No
intravenous contrast was administered.

[Series 4: T2 fat-sat · coronal · 4.0mm · 0.59mm/px · 6 of 27 slices shown (1 of 2)]
[im 1/27]
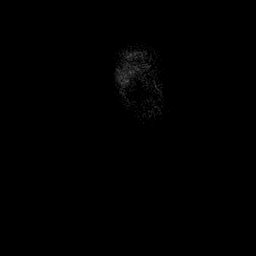
[im 6/27]
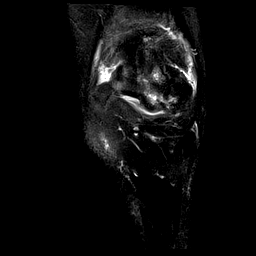
[im 11/27]
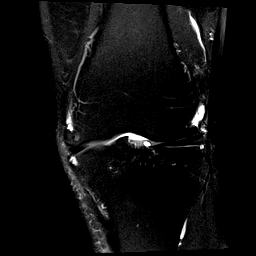
[im 16/27]
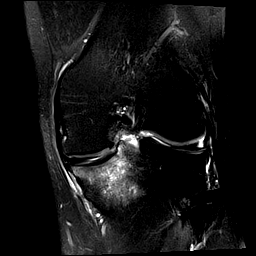
[im 21/27]
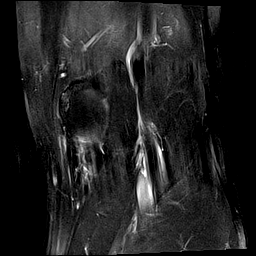
[im 27/27]
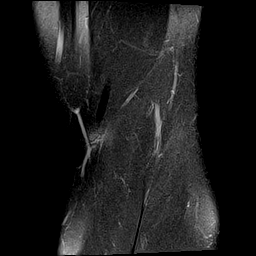

[Series 5: T1 · coronal · 4.0mm · 0.29mm/px · 3 of 27 slices shown]
[im 6/27]
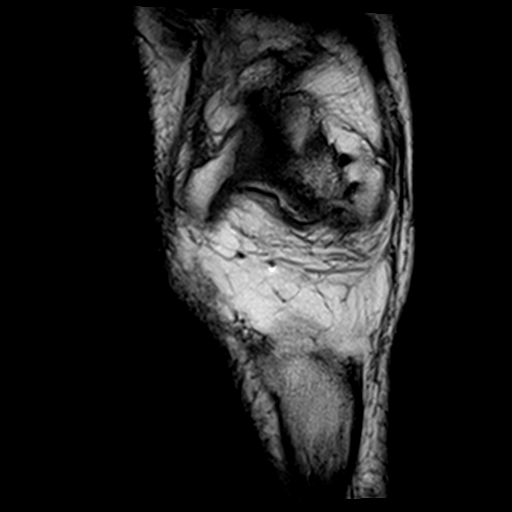
[im 16/27]
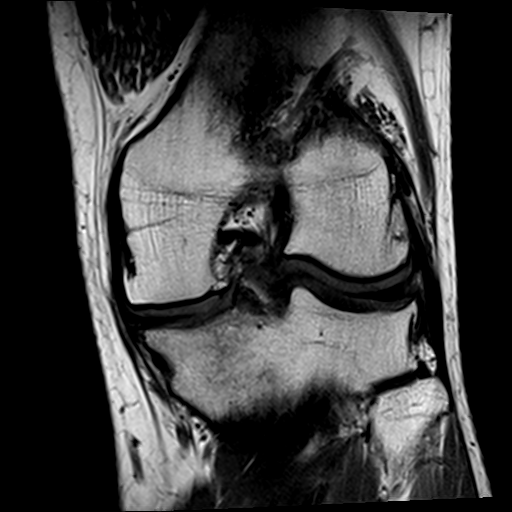
[im 27/27]
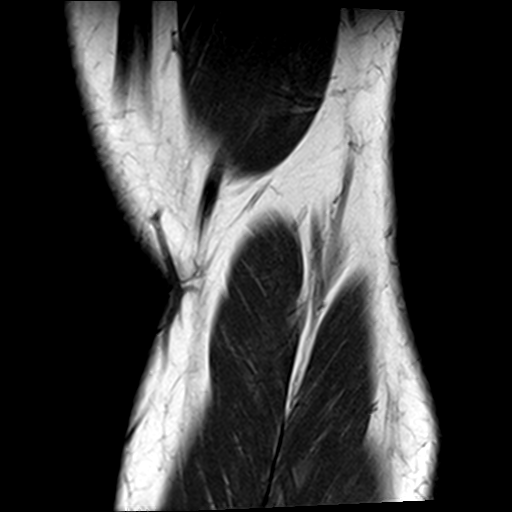

[Series 7: PD fat-sat · sagittal · 3.0mm · 0.29mm/px · 7 of 28 slices shown]
[im 1/28]
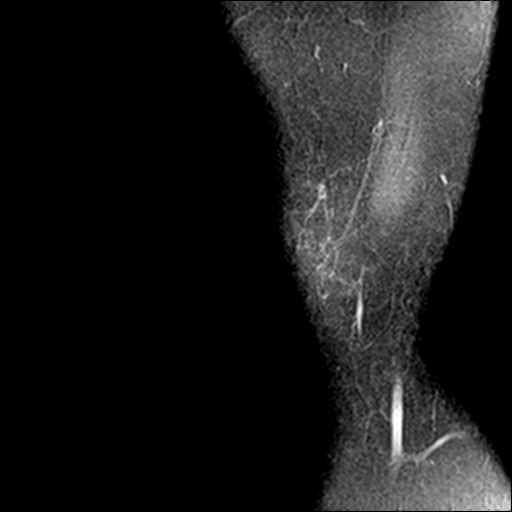
[im 5/28]
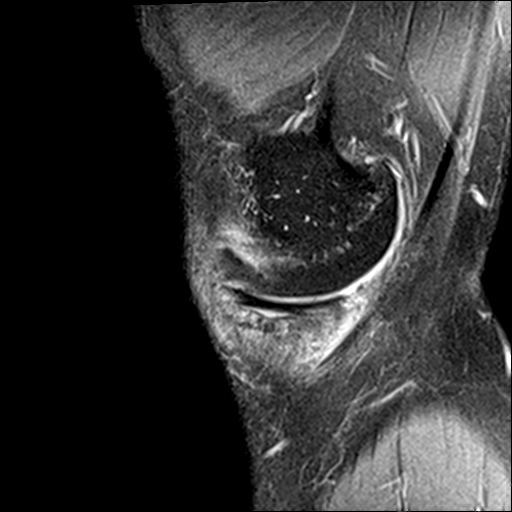
[im 10/28]
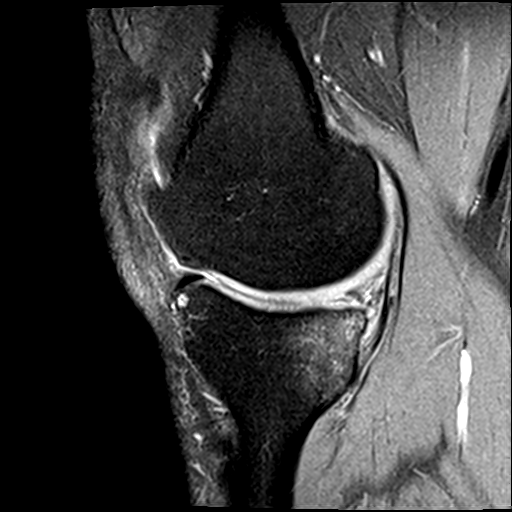
[im 14/28]
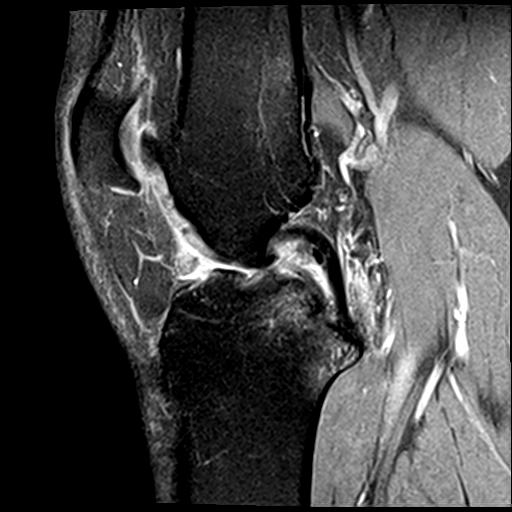
[im 19/28]
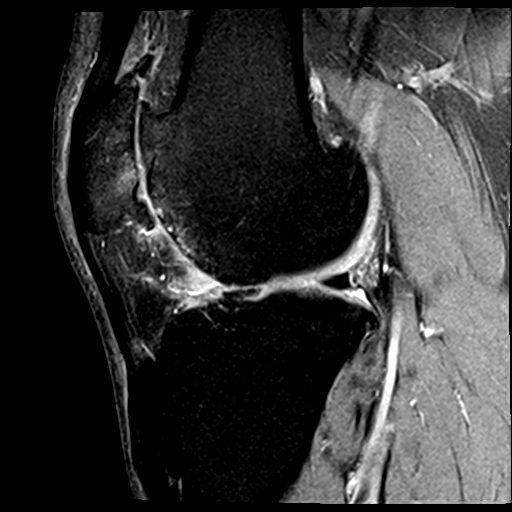
[im 23/28]
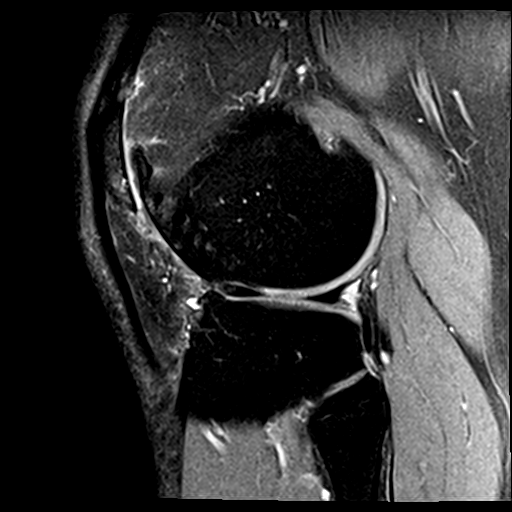
[im 28/28]
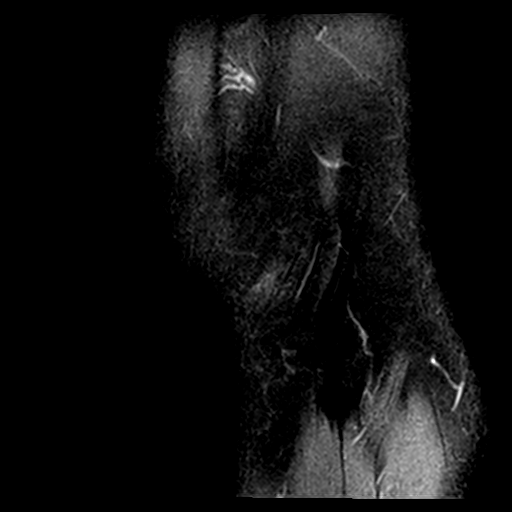

[Series 8: T2 fat-sat · sagittal · 3.0mm · 0.29mm/px · 6 of 28 slices shown (2 of 2)]
[im 1/28]
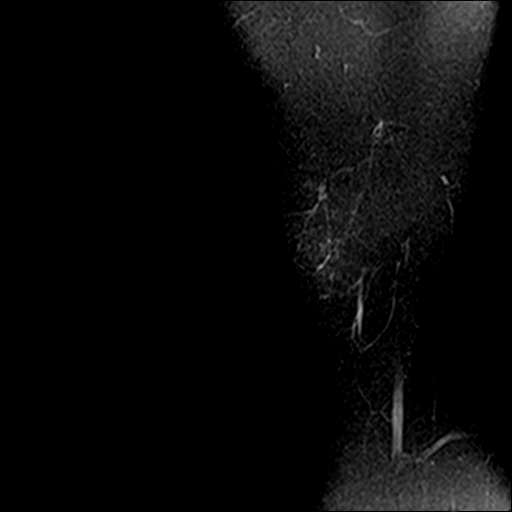
[im 5/28]
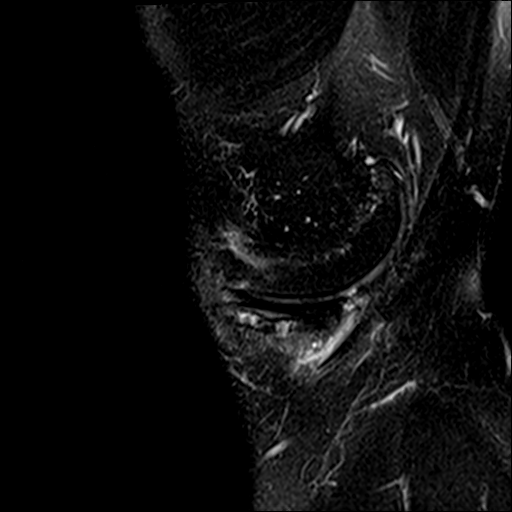
[im 10/28]
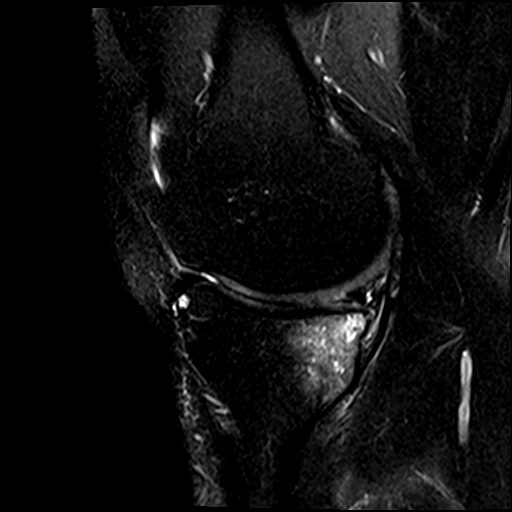
[im 14/28]
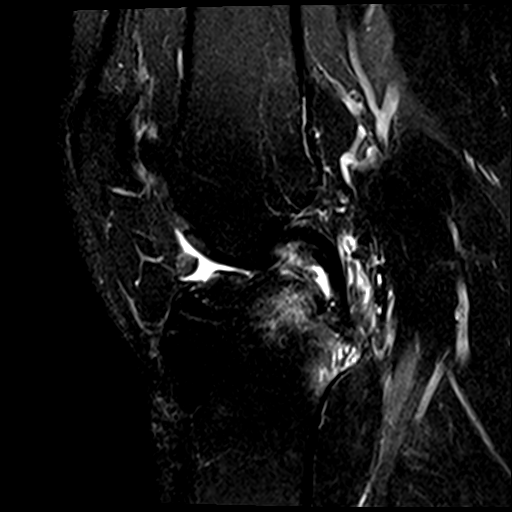
[im 19/28]
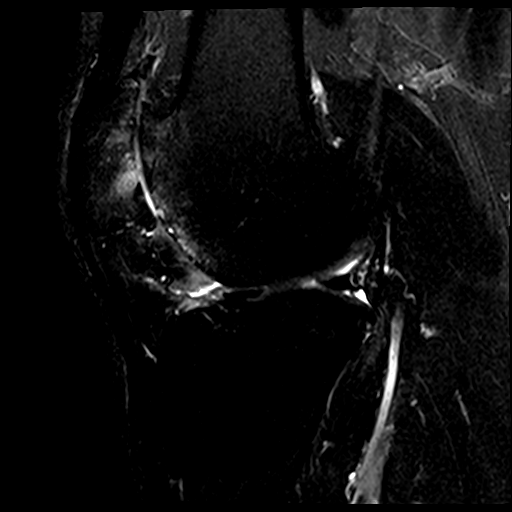
[im 23/28]
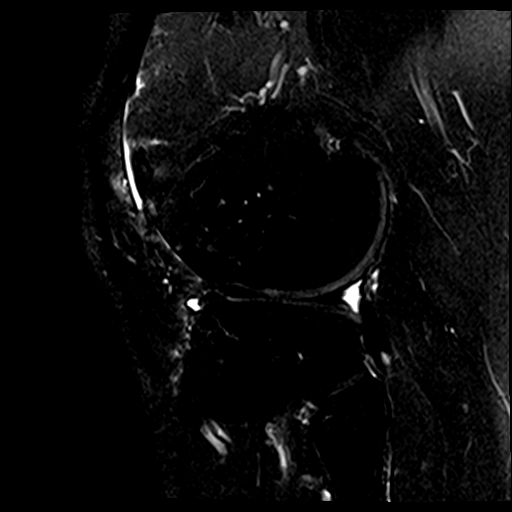

[22 of 40 positions shown; findings below may reference images not displayed]

FINDINGS: MENISCI

Medial: There is complex horizontal longitudinal tear seen at the
posterior horn of the medial meniscus which extends to the root
attachment. There is slight extrusion of the mid body.

Lateral: There is slight blunting of the anterior horn of the
lateral meniscus. There is a small 5 mm T2 bright cystic lesion seen
posterior to the posterior horn of the lateral meniscus, likely
ganglion cyst.

LIGAMENTS

Cruciates: The ACL is intact. The PCL is intact.

Collaterals: There is mild edema seen around the superficial fibers
of the medial collateral ligament, however it is intact. The lateral
collateral ligamentous complex is intact.

CARTILAGE

Patellofemoral: There is areas of deep chondral fissuring with
diffuse chondral loss seen within the lateral patellar facet and
lateral trochlear surface with underlying subchondral cystic
changes. There is also chondral fissuring seen in the central
patellar apex with marginal osteophyte formation. There is slight
lateral subluxation of patella.

Medial compartment: Mild chondral fissuring seen the weight-bearing
surface of medial femoral condyle.

Lateral compartment: There is mild chondral fissuring seen the
weight-bearing surface of the lateral femoral condyle and the
lateral tibial plateau.

BONES: There is increased marrow signal changes seen in the
posterior medial tibial plateau and proximal tibia. No definite
osseous fracture however is noted. No avascular necrosis. No
pathologic marrow infiltration.

JOINT: There is edema seen in the superolateral corner of Hoffa's
fat pad. No large joint effusion is seen.

EXTENSOR MECHANISM: The patellar and quadriceps tendon are intact.
The retinaculum is unremarkable.

POPLITEAL FOSSA: No popliteal cyst.

OTHER: There is a tiny amount of fluid seen at the pes anserine
bursa. The visualized muscles are normal in appearance.
IMPRESSION: 1. Complex tear of the posterior medial meniscus which extends to
the root attachment. There is slight extrusion of the mid body.
2. Blunting of the anterior lateral meniscus.
3. Grade 1 medial collateral ligamentous sprain.
4. Tricompartmental osteoarthritis, advanced within the
patellofemoral compartment.
5. Findings suggestive of patellofemoral maltracking
6. Osseous contusion/reactive marrow within the posterior medial
tibia. No osseous fracture.
7. Small amount of fluid in the pes anserine bursa.

## 2019-04-12 NOTE — Telephone Encounter (Signed)
Left knee MRI scan shows a complex tear of the medial meniscus.  There is also arthritis throughout the knee.  If pain has not improved after Synvisc injection, then we will arrange surgical consultation.

## 2019-04-12 NOTE — Telephone Encounter (Signed)
I called and advised the patient of the results. Scheduled a surgical consult for tomorrow at 3:15 with Dr. Erlinda Hong.

## 2019-04-13 ENCOUNTER — Ambulatory Visit (INDEPENDENT_AMBULATORY_CARE_PROVIDER_SITE_OTHER): Payer: 59 | Admitting: Orthopaedic Surgery

## 2019-04-13 ENCOUNTER — Encounter: Payer: Self-pay | Admitting: Orthopaedic Surgery

## 2019-04-13 DIAGNOSIS — M84362A Stress fracture, left tibia, initial encounter for fracture: Secondary | ICD-10-CM | POA: Diagnosis not present

## 2019-04-13 DIAGNOSIS — S83242A Other tear of medial meniscus, current injury, left knee, initial encounter: Secondary | ICD-10-CM

## 2019-04-13 NOTE — Progress Notes (Signed)
Office Visit Note   Patient: Tony Waller           Date of Birth: 12-14-1970           MRN: [id number] Visit Date: 04/13/2019              Requested by: Tony Waller, Tony Waller,  Tony Waller Tony Waller Tony Waller   Assessment & Plan: Visit Diagnoses:  1. Acute medial meniscus tear of left knee, initial encounter   2. Stress fracture of left tibia, initial encounter     Plan: Impression is acute left medial meniscus tear and slight extrusion causing stress fracture of the posterior medial tibia.  The MRI findings were review and discussed with the patient in detail and recommendation is for arthroscopic partial medial meniscectomy and sub-chondroplasty of the proximal medial tibia.  He also complains of faint posterior lateral pain which could be related to ganglion cyst posterior to the lateral meniscus which we will evaluate intraoperatively.  Given his age and severity of DJD I would like to get him set up with a medial unloader brace postoperatively.  Risks and possible complications as well as rehab and recovery reviewed with the patient today.  Patient will call us back in the near future to schedule the surgery.  Follow-Up Instructions: Return if symptoms worsen or fail to improve.   Orders:  No orders of the defined types were placed in this encounter.  No orders of the defined types were placed in this encounter.     Procedures: No procedures performed   Clinical Data: No additional findings.   Subjective: Chief Complaint  Patient presents with  . Left Knee - Pain    Tony Waller is a 48 year old gentleman who has been referred to me by Dr. Junius Waller for acute left knee medial meniscal tear.  He had an injury a year ago when he tripped during a funeral and fell on his left knee.  He states that since then he has had worsening left knee pain on the medial and posterior aspect.  He does endorse some swelling.  The pain is worse with weightbearing.  He  states that the pain is different than the chronic dull aching arthritic pain that he has been living with.  He endorses sharp stabbing pain is worse with going downhill and turning.  He has had cortisone and viscosupplementation injections without significant relief.   Review of Systems  Constitutional: Negative.   All other systems reviewed and are negative.    Objective: Vital Signs: There were no vitals taken for this visit.  Physical Exam Vitals signs and nursing note reviewed.  Constitutional:      Appearance: He is well-developed.  HENT:     Head: Normocephalic and atraumatic.  Eyes:     Pupils: Pupils are equal, round, and reactive to light.  Neck:     Musculoskeletal: Neck supple.  Pulmonary:     Effort: Pulmonary effort is normal.  Abdominal:     Palpations: Abdomen is soft.  Musculoskeletal: Normal range of motion.  Skin:    General: Skin is warm.  Neurological:     Mental Status: He is alert and oriented to person, place, and time.  Psychiatric:        Behavior: Behavior normal.        Thought Content: Thought content normal.        Judgment: Judgment normal.     Ortho Exam Left knee exam shows  no significant joint effusion.  Exquisite medial joint line tenderness and proximal medial tibia tenderness.  Normal range of motion.  Collaterals and cruciates are stable. Specialty Comments:  No specialty comments available.  Imaging: No results found.   PMFS History: Patient Active Problem List   Diagnosis Date Noted  . Acute medial meniscus tear of left knee 04/13/2019  . Stress fracture of left tibia 04/13/2019  . OBSTRUCTIVE SLEEP APNEA 05/28/2009  . ALLERGIC RHINITIS 05/28/2009   Past Medical History:  Diagnosis Date  . ALLERGIC RHINITIS   . OSA (obstructive sleep apnea)     Family History  Problem Relation Age of Onset  . Allergies Tony Waller   . Heart disease Tony Waller   . Lung cancer Tony Waller   . Brain cancer Tony  Waller     Past Surgical History:  Procedure Laterality Date  . BUNIONECTOMY Left   . NASAL SINUS SURGERY  2000   for deviated septum and turbonate reduction  . TONSILLECTOMY  1994   Social History   Occupational History  . Occupation: Tony Waller, Tony Waller: Tony Waller  Tobacco Use  . Smoking status: Former Smoker    Years: 9.00    Types: Cigars  . Smokeless tobacco: Never Used  Substance and Sexual Activity  . Alcohol use: Yes  . Drug use: Not on file  . Sexual activity: Not on file

## 2019-06-10 ENCOUNTER — Telehealth: Payer: Self-pay

## 2019-06-10 NOTE — Telephone Encounter (Signed)
Patient left voice mail wanting to schedule surgery.  Need surgery sheet.  [phone]

## 2019-06-11 NOTE — Telephone Encounter (Signed)
Left knee scope PMM, subchondroplasty, exicision of cyst All arthrex, large C arm, [facility] for subchondroplasty [id number], [id number], [id number] [facility] day.

## 2019-06-16 ENCOUNTER — Other Ambulatory Visit: Payer: Self-pay

## 2019-06-16 ENCOUNTER — Encounter (HOSPITAL_BASED_OUTPATIENT_CLINIC_OR_DEPARTMENT_OTHER): Payer: Self-pay | Admitting: *Deleted

## 2019-06-16 NOTE — Telephone Encounter (Signed)
Spoke with patient and scheduled.

## 2019-06-18 ENCOUNTER — Other Ambulatory Visit (HOSPITAL_COMMUNITY)
Admission: RE | Admit: 2019-06-18 | Discharge: 2019-06-18 | Disposition: A | Discharge status: Home / Self Care | Payer: 59 | Source: Ambulatory Visit | Attending: Orthopaedic Surgery | Admitting: Orthopaedic Surgery

## 2019-06-18 DIAGNOSIS — Z20828 Contact with and (suspected) exposure to other viral communicable diseases: Secondary | ICD-10-CM | POA: Insufficient documentation

## 2019-06-18 DIAGNOSIS — Z01812 Encounter for preprocedural laboratory examination: Secondary | ICD-10-CM | POA: Insufficient documentation

## 2019-06-19 LAB — NOVEL CORONAVIRUS, NAA (HOSP ORDER, SEND-OUT TO REF LAB; TAT 18-24 HRS): SARS-CoV-2, NAA: NOT DETECTED

## 2019-06-21 ENCOUNTER — Encounter (HOSPITAL_BASED_OUTPATIENT_CLINIC_OR_DEPARTMENT_OTHER)
Admission: RE | Admit: 2019-06-21 | Discharge: 2019-06-21 | Disposition: A | Discharge status: Home / Self Care | Payer: No Typology Code available for payment source | Source: Ambulatory Visit | Attending: Orthopaedic Surgery | Admitting: Orthopaedic Surgery

## 2019-06-21 DIAGNOSIS — Z880 Allergy status to penicillin: Secondary | ICD-10-CM | POA: Diagnosis not present

## 2019-06-21 DIAGNOSIS — Z20828 Contact with and (suspected) exposure to other viral communicable diseases: Secondary | ICD-10-CM | POA: Insufficient documentation

## 2019-06-21 DIAGNOSIS — S83242A Other tear of medial meniscus, current injury, left knee, initial encounter: Secondary | ICD-10-CM | POA: Diagnosis not present

## 2019-06-21 DIAGNOSIS — Z791 Long term (current) use of non-steroidal anti-inflammatories (NSAID): Secondary | ICD-10-CM | POA: Diagnosis not present

## 2019-06-21 DIAGNOSIS — M85662 Other cyst of bone, left lower leg: Secondary | ICD-10-CM | POA: Diagnosis not present

## 2019-06-21 DIAGNOSIS — X58XXXA Exposure to other specified factors, initial encounter: Secondary | ICD-10-CM | POA: Diagnosis not present

## 2019-06-21 DIAGNOSIS — G4733 Obstructive sleep apnea (adult) (pediatric): Secondary | ICD-10-CM | POA: Diagnosis not present

## 2019-06-21 DIAGNOSIS — Z01812 Encounter for preprocedural laboratory examination: Secondary | ICD-10-CM | POA: Diagnosis not present

## 2019-06-21 DIAGNOSIS — M84362A Stress fracture, left tibia, initial encounter for fracture: Secondary | ICD-10-CM | POA: Diagnosis not present

## 2019-06-21 DIAGNOSIS — Z87891 Personal history of nicotine dependence: Secondary | ICD-10-CM | POA: Diagnosis not present

## 2019-06-21 LAB — BASIC METABOLIC PANEL
Anion gap: 7 (ref 5–15)
BUN: 13 mg/dL (ref 6–20)
CO2: 26 mmol/L (ref 22–32)
Calcium: 10.2 mg/dL (ref 8.9–10.3)
Chloride: 105 mmol/L (ref 98–111)
Creatinine, Ser: 1.07 mg/dL (ref 0.61–1.24)
GFR calc Af Amer: >60 mL/min (ref >60)
GFR calc non Af Amer: >60 mL/min (ref >60)
Glucose, Bld: 102 mg/dL — ABNORMAL HIGH (ref 70–99)
Potassium: 4.8 mmol/L (ref 3.5–5.1)
Sodium: 138 mmol/L (ref 135–145)

## 2019-06-21 NOTE — Progress Notes (Signed)

## 2019-06-22 ENCOUNTER — Ambulatory Visit (HOSPITAL_BASED_OUTPATIENT_CLINIC_OR_DEPARTMENT_OTHER)
Admission: RE | Admit: 2019-06-22 | Discharge: 2019-06-22 | Disposition: A | Discharge status: Home / Self Care | Payer: No Typology Code available for payment source | Source: Ambulatory Visit | Attending: Orthopaedic Surgery | Admitting: Orthopaedic Surgery

## 2019-06-22 ENCOUNTER — Other Ambulatory Visit: Payer: Self-pay

## 2019-06-22 ENCOUNTER — Encounter (HOSPITAL_BASED_OUTPATIENT_CLINIC_OR_DEPARTMENT_OTHER)
Admission: RE | Disposition: A | Discharge status: Home / Self Care | Payer: Self-pay | Source: Ambulatory Visit | Attending: Orthopaedic Surgery

## 2019-06-22 ENCOUNTER — Ambulatory Visit (HOSPITAL_BASED_OUTPATIENT_CLINIC_OR_DEPARTMENT_OTHER): Payer: No Typology Code available for payment source | Admitting: Certified Registered"

## 2019-06-22 ENCOUNTER — Encounter (HOSPITAL_BASED_OUTPATIENT_CLINIC_OR_DEPARTMENT_OTHER): Payer: Self-pay | Admitting: *Deleted

## 2019-06-22 ENCOUNTER — Ambulatory Visit (HOSPITAL_COMMUNITY): Payer: No Typology Code available for payment source

## 2019-06-22 DIAGNOSIS — M85662 Other cyst of bone, left lower leg: Secondary | ICD-10-CM | POA: Insufficient documentation

## 2019-06-22 DIAGNOSIS — M84362A Stress fracture, left tibia, initial encounter for fracture: Secondary | ICD-10-CM

## 2019-06-22 DIAGNOSIS — Z419 Encounter for procedure for purposes other than remedying health state, unspecified: Secondary | ICD-10-CM

## 2019-06-22 DIAGNOSIS — S83242A Other tear of medial meniscus, current injury, left knee, initial encounter: Secondary | ICD-10-CM | POA: Diagnosis not present

## 2019-06-22 DIAGNOSIS — G4733 Obstructive sleep apnea (adult) (pediatric): Secondary | ICD-10-CM | POA: Insufficient documentation

## 2019-06-22 DIAGNOSIS — Z87891 Personal history of nicotine dependence: Secondary | ICD-10-CM | POA: Insufficient documentation

## 2019-06-22 DIAGNOSIS — X58XXXA Exposure to other specified factors, initial encounter: Secondary | ICD-10-CM | POA: Insufficient documentation

## 2019-06-22 DIAGNOSIS — Z791 Long term (current) use of non-steroidal anti-inflammatories (NSAID): Secondary | ICD-10-CM | POA: Insufficient documentation

## 2019-06-22 DIAGNOSIS — Z880 Allergy status to penicillin: Secondary | ICD-10-CM | POA: Insufficient documentation

## 2019-06-22 HISTORY — PX: KNEE ARTHROSCOPY WITH SUBCHONDROPLASTY: SHX6732

## 2019-06-22 IMAGING — RF DG C-ARM 1-60 MIN
1 series · 2 of 2 positions shown · non-contrast
Comparison: [DATE].

CLINICAL DATA: Left knee subchondroplasty.

EXAM:
LEFT KNEE - 3 VIEW; DG C-ARM 1-60 MIN
FLUOROSCOPY TIME:  29 seconds.

[Series 1: run · 2 of 2 slices shown]
[im 1/2]
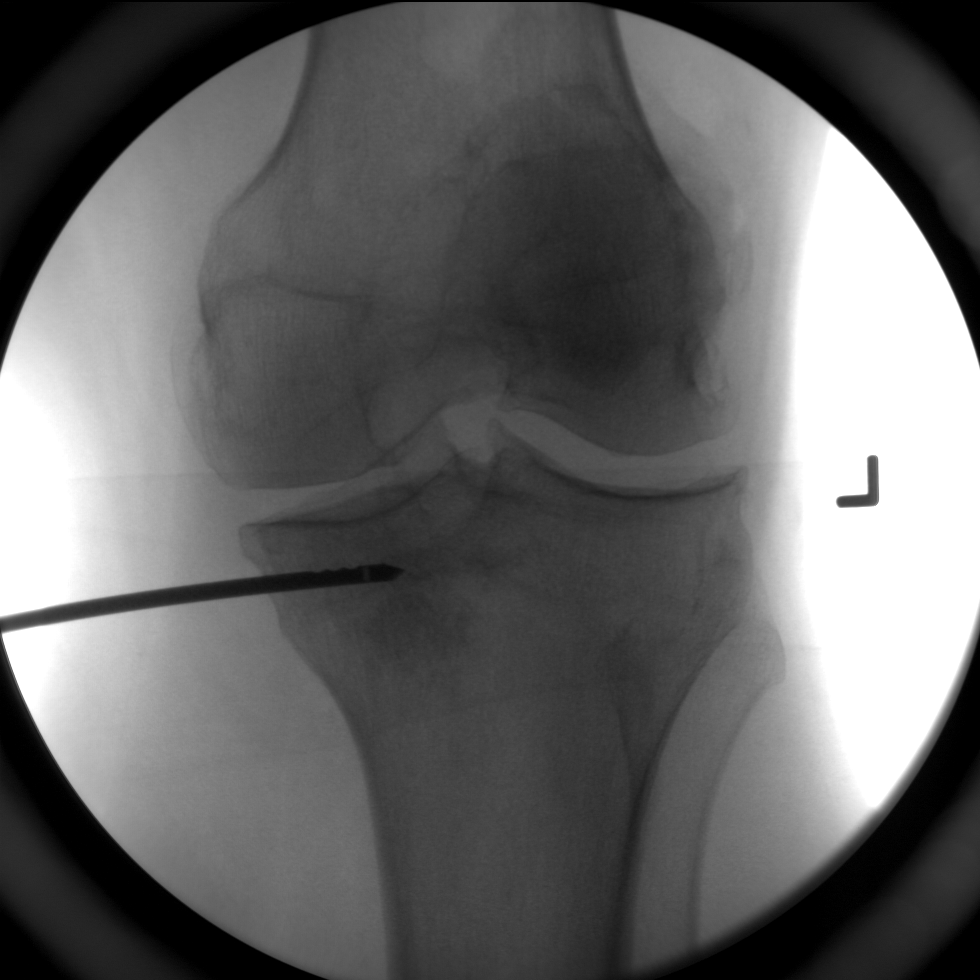
[im 2/2]
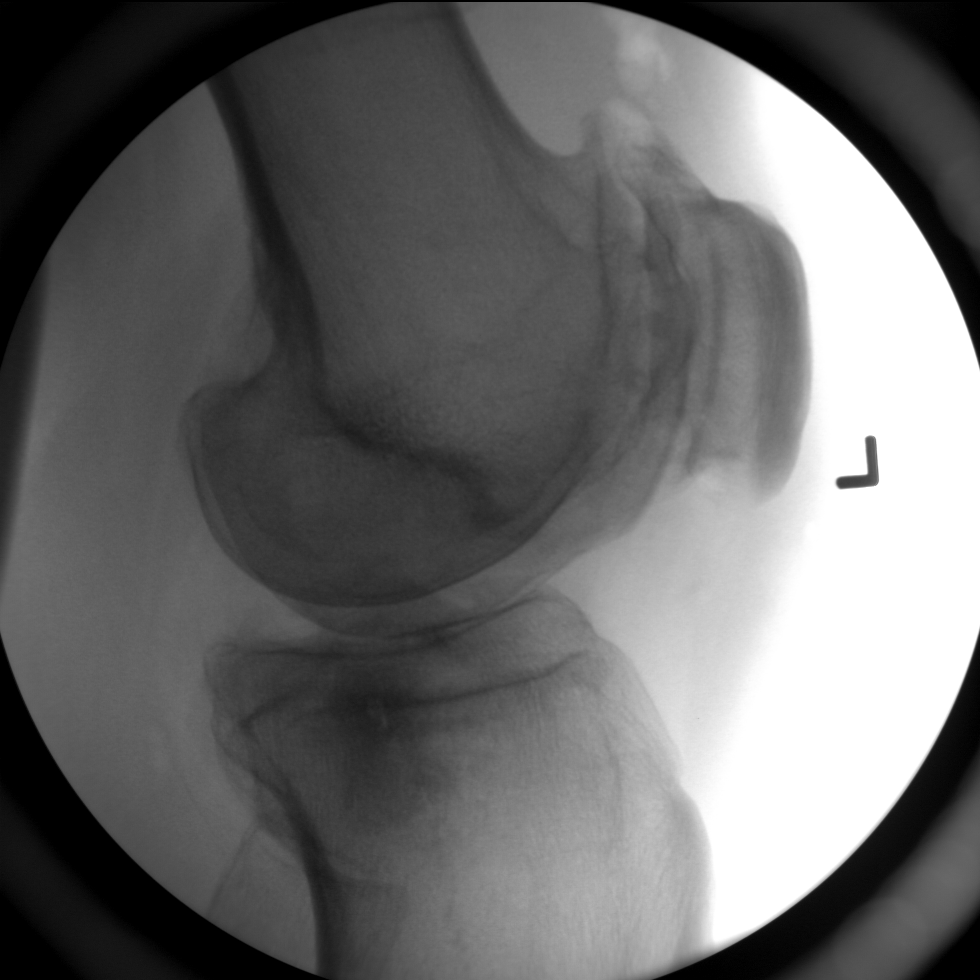

[2 of 2 positions shown; findings below may reference images not displayed]

FINDINGS: Two intraoperative fluoroscopic images were obtained of the left
knee. These images demonstrate surgical pin or device overlying
medial tibial plateau.
IMPRESSION: Fluoroscopic guidance provided during left knee subchondroplasty.

## 2019-06-22 IMAGING — RF DG KNEE 3 VIEWS*L*
1 series · 2 of 2 positions shown · non-contrast
Comparison: [DATE].

CLINICAL DATA: Left knee subchondroplasty.

EXAM:
LEFT KNEE - 3 VIEW; DG C-ARM 1-60 MIN
FLUOROSCOPY TIME:  29 seconds.

[Series 1: run · 2 of 2 slices shown]
[im 1/2]
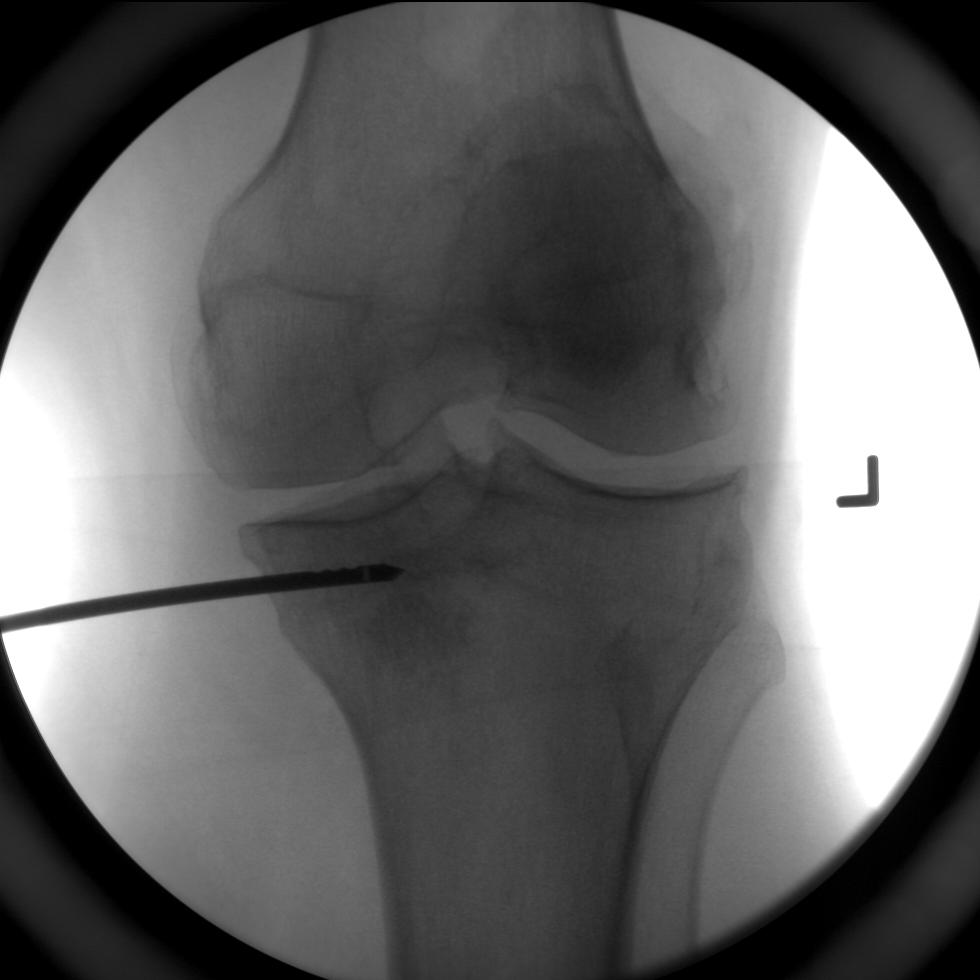
[im 2/2]
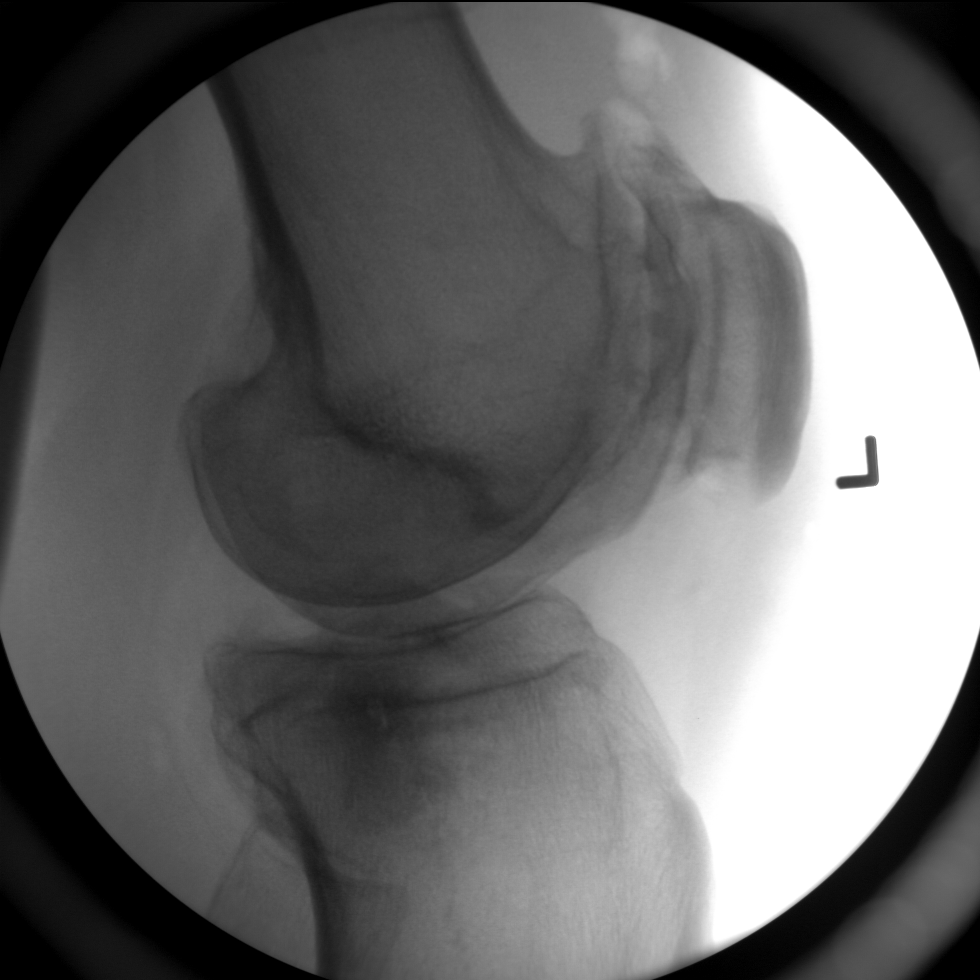

[2 of 2 positions shown; findings below may reference images not displayed]

FINDINGS: Two intraoperative fluoroscopic images were obtained of the left
knee. These images demonstrate surgical pin or device overlying
medial tibial plateau.
IMPRESSION: Fluoroscopic guidance provided during left knee subchondroplasty.

## 2019-06-22 SURGERY — ARTHROSCOPY, KNEE, WITH SUBCHONDROPLASTY
Anesthesia: General | Site: Knee | Laterality: Left

## 2019-06-22 MED ORDER — FENTANYL CITRATE (PF) 100 MCG/2ML IJ SOLN
50.0000 ug | INTRAMUSCULAR | Status: AC | PRN
  Administered 2019-06-22: 100 ug via INTRAVENOUS
  Administered 2019-06-22 (×2): 50 ug via INTRAVENOUS

## 2019-06-22 MED ORDER — ONDANSETRON HCL 4 MG PO TABS: 4.0000 mg | ORAL_TABLET | Freq: Three times a day (TID) | ORAL | 0 refills | Status: DC | PRN

## 2019-06-22 MED ORDER — DEXAMETHASONE SODIUM PHOSPHATE 10 MG/ML IJ SOLN
INTRAMUSCULAR | Status: DC | PRN
  Administered 2019-06-22: 10 mg via INTRAVENOUS

## 2019-06-22 MED ORDER — CEFAZOLIN SODIUM-DEXTROSE 2-4 GM/100ML-% IV SOLN
INTRAVENOUS | Status: AC
  Filled 2019-06-22: qty 100

## 2019-06-22 MED ORDER — CEFAZOLIN SODIUM-DEXTROSE 2-3 GM-%(50ML) IV SOLR
INTRAVENOUS | Status: DC | PRN
  Administered 2019-06-22: 3 g via INTRAVENOUS

## 2019-06-22 MED ORDER — BUPIVACAINE HCL (PF) 0.25 % IJ SOLN
INTRAMUSCULAR | Status: AC
  Filled 2019-06-22: qty 30

## 2019-06-22 MED ORDER — DEXAMETHASONE SODIUM PHOSPHATE 10 MG/ML IJ SOLN
INTRAMUSCULAR | Status: AC
  Filled 2019-06-22: qty 1

## 2019-06-22 MED ORDER — DEXTROSE 5 % IV SOLN: 3.0000 g | INTRAVENOUS | Status: DC

## 2019-06-22 MED ORDER — OXYCODONE-ACETAMINOPHEN 5-325 MG PO TABS: 1.0000 | ORAL_TABLET | Freq: Three times a day (TID) | ORAL | 0 refills | Status: DC | PRN

## 2019-06-22 MED ORDER — CLONIDINE HCL (ANALGESIA) 100 MCG/ML EP SOLN
EPIDURAL | Status: DC | PRN
  Administered 2019-06-22: 100 ug

## 2019-06-22 MED ORDER — BUPIVACAINE HCL (PF) 0.25 % IJ SOLN
INTRAMUSCULAR | Status: DC | PRN
  Administered 2019-06-22: 20 mL

## 2019-06-22 MED ORDER — OXYCODONE HCL 5 MG/5ML PO SOLN: 5.0000 mg | Freq: Once | ORAL | Status: DC | PRN

## 2019-06-22 MED ORDER — ROPIVACAINE HCL 5 MG/ML IJ SOLN
INTRAMUSCULAR | Status: DC | PRN
  Administered 2019-06-22 (×2): 5 mL via PERINEURAL

## 2019-06-22 MED ORDER — MIDAZOLAM HCL 2 MG/2ML IJ SOLN
INTRAMUSCULAR | Status: AC
  Filled 2019-06-22: qty 2

## 2019-06-22 MED ORDER — MIDAZOLAM HCL 2 MG/2ML IJ SOLN
1.0000 mg | INTRAMUSCULAR | Status: DC | PRN
  Administered 2019-06-22: 2 mg via INTRAVENOUS
  Administered 2019-06-22 (×2): 1 mg via INTRAVENOUS

## 2019-06-22 MED ORDER — ROPIVACAINE HCL 7.5 MG/ML IJ SOLN
INTRAMUSCULAR | Status: DC | PRN
  Administered 2019-06-22 (×4): 5 mL via PERINEURAL

## 2019-06-22 MED ORDER — MEPERIDINE HCL 25 MG/ML IJ SOLN: 6.2500 mg | INTRAMUSCULAR | Status: DC | PRN

## 2019-06-22 MED ORDER — FENTANYL CITRATE (PF) 100 MCG/2ML IJ SOLN
INTRAMUSCULAR | Status: AC
  Filled 2019-06-22: qty 2

## 2019-06-22 MED ORDER — ONDANSETRON HCL 4 MG/2ML IJ SOLN
INTRAMUSCULAR | Status: AC
  Filled 2019-06-22: qty 2

## 2019-06-22 MED ORDER — PROPOFOL 10 MG/ML IV BOLUS
INTRAVENOUS | Status: DC | PRN
  Administered 2019-06-22: 250 mg via INTRAVENOUS

## 2019-06-22 MED ORDER — CEFAZOLIN SODIUM-DEXTROSE 1-4 GM/50ML-% IV SOLN
INTRAVENOUS | Status: AC
  Filled 2019-06-22: qty 50

## 2019-06-22 MED ORDER — LACTATED RINGERS IV SOLN
INTRAVENOUS | Status: DC
  Administered 2019-06-22: 08:00:00 via INTRAVENOUS

## 2019-06-22 MED ORDER — ONDANSETRON HCL 4 MG/2ML IJ SOLN
INTRAMUSCULAR | Status: DC | PRN
  Administered 2019-06-22: 4 mg via INTRAVENOUS

## 2019-06-22 MED ORDER — ONDANSETRON HCL 4 MG/2ML IJ SOLN: 4.0000 mg | Freq: Once | INTRAMUSCULAR | Status: DC | PRN

## 2019-06-22 MED ORDER — ACETAMINOPHEN 160 MG/5ML PO SOLN: 325.0000 mg | ORAL | Status: DC | PRN

## 2019-06-22 MED ORDER — SODIUM CHLORIDE 0.9 % IR SOLN
Status: DC | PRN
  Administered 2019-06-22: 12000 mL

## 2019-06-22 MED ORDER — FENTANYL CITRATE (PF) 100 MCG/2ML IJ SOLN: 25.0000 ug | INTRAMUSCULAR | Status: DC | PRN

## 2019-06-22 MED ORDER — LIDOCAINE 2% (20 MG/ML) 5 ML SYRINGE
INTRAMUSCULAR | Status: AC
  Filled 2019-06-22: qty 5

## 2019-06-22 MED ORDER — LACTATED RINGERS IV SOLN
INTRAVENOUS | Status: DC
  Administered 2019-06-22: 10:00:00 via INTRAVENOUS

## 2019-06-22 MED ORDER — OXYCODONE HCL 5 MG PO TABS: 5.0000 mg | ORAL_TABLET | Freq: Once | ORAL | Status: DC | PRN

## 2019-06-22 MED ORDER — ACETAMINOPHEN 325 MG PO TABS: 325.0000 mg | ORAL_TABLET | ORAL | Status: DC | PRN

## 2019-06-22 MED ORDER — CHLORHEXIDINE GLUCONATE 4 % EX LIQD: 60.0000 mL | Freq: Once | CUTANEOUS | Status: DC

## 2019-06-22 MED ORDER — KETOROLAC TROMETHAMINE 10 MG PO TABS: 10.0000 mg | ORAL_TABLET | Freq: Two times a day (BID) | ORAL | 0 refills | Status: DC | PRN

## 2019-06-22 MED ORDER — PROPOFOL 10 MG/ML IV BOLUS
INTRAVENOUS | Status: AC
  Filled 2019-06-22: qty 40

## 2019-06-22 MED ORDER — KETOROLAC TROMETHAMINE 30 MG/ML IJ SOLN: 30.0000 mg | Freq: Once | INTRAMUSCULAR | Status: DC | PRN

## 2019-06-22 SURGICAL SUPPLY — 47 items
BANDAGE ESMARK 6X9 LF (GAUZE/BANDAGES/DRESSINGS) IMPLANT
BLADE CUDA GRT WHITE 3.5 (BLADE) IMPLANT
BLADE LANZA CVD 15 DEG (BLADE) IMPLANT
BNDG ELASTIC 6X5.8 VLCR STR LF (GAUZE/BANDAGES/DRESSINGS) ×4 IMPLANT
BNDG ESMARK 6X9 LF (GAUZE/BANDAGES/DRESSINGS)
BUR OVAL 4.0 (BURR) IMPLANT
COVER WAND RF STERILE (DRAPES) IMPLANT
CUFF TOURN SGL QUICK 34 (TOURNIQUET CUFF) ×2
CUFF TRNQT CYL 34X4.125X (TOURNIQUET CUFF) ×1 IMPLANT
DISSECTOR 3.5MM X 13CM CVD (MISCELLANEOUS) ×2 IMPLANT
DRAPE ARTHROSCOPY W/POUCH 90 (DRAPES) ×2 IMPLANT
DRAPE C-ARM 42X72 X-RAY (DRAPES) ×2 IMPLANT
DRAPE C-ARMOR (DRAPES) ×2 IMPLANT
DRAPE HALF SHEET 70X43 (DRAPES) ×2 IMPLANT
DRAPE IMP U-DRAPE 54X76 (DRAPES) ×2 IMPLANT
DRAPE U-SHAPE 47X51 STRL (DRAPES) ×2 IMPLANT
DURAPREP 26ML APPLICATOR (WOUND CARE) ×2 IMPLANT
ELECT MENISCUS 165MM 90D (ELECTRODE) IMPLANT
ELECT REM PT RETURN 9FT ADLT (ELECTROSURGICAL)
ELECTRODE REM PT RTRN 9FT ADLT (ELECTROSURGICAL) IMPLANT
GAUZE SPONGE 4X4 12PLY STRL (GAUZE/BANDAGES/DRESSINGS) ×4 IMPLANT
GAUZE XEROFORM 1X8 LF (GAUZE/BANDAGES/DRESSINGS) ×2 IMPLANT
GLOVE BIOGEL PI IND STRL 7.0 (GLOVE) ×1 IMPLANT
GLOVE BIOGEL PI INDICATOR 7.0 (GLOVE) ×1
GLOVE ECLIPSE 7.0 STRL STRAW (GLOVE) ×2 IMPLANT
GLOVE SKINSENSE NS SZ7.5 (GLOVE) ×1
GLOVE SKINSENSE STRL SZ7.5 (GLOVE) ×1 IMPLANT
GLOVE SURG SYN 7.5  E (GLOVE) ×1
GLOVE SURG SYN 7.5 E (GLOVE) ×1 IMPLANT
GOWN STRL REIN XL XLG (GOWN DISPOSABLE) ×2 IMPLANT
GOWN STRL REUS W/ TWL LRG LVL3 (GOWN DISPOSABLE) ×4 IMPLANT
GOWN STRL REUS W/ TWL XL LVL3 (GOWN DISPOSABLE) ×1 IMPLANT
GOWN STRL REUS W/TWL LRG LVL3 (GOWN DISPOSABLE) ×8
GOWN STRL REUS W/TWL XL LVL3 (GOWN DISPOSABLE) ×2
KIT ACCUFILL 5CC (Knees) ×1 IMPLANT
KIT KNEE SCP 414.502 (Knees) ×2 IMPLANT
KNEE WRAP E Z 3 GEL PACK (MISCELLANEOUS) ×2 IMPLANT
MANIFOLD NEPTUNE II (INSTRUMENTS) ×2 IMPLANT
PACK ARTHROSCOPY DSU (CUSTOM PROCEDURE TRAY) ×2 IMPLANT
PACK BASIN DAY SURGERY FS (CUSTOM PROCEDURE TRAY) ×2 IMPLANT
PENCIL SMOKE EVACUATOR (MISCELLANEOUS) IMPLANT
RESECTOR TORPEDO 4MM 13CM CVD (MISCELLANEOUS) ×2 IMPLANT
SHAVER 4.2 MM LANZA 9391A (BLADE) IMPLANT
SUT ETHILON 3 0 PS 1 (SUTURE) ×2 IMPLANT
TOWEL GREEN STERILE FF (TOWEL DISPOSABLE) ×2 IMPLANT
TUBING ARTHRO INFLOW-ONLY STRL (TUBING) ×2 IMPLANT
WATER STERILE IRR 1000ML POUR (IV SOLUTION) IMPLANT

## 2019-06-22 NOTE — Anesthesia Postprocedure Evaluation (Signed)
Anesthesia Post Note  Patient: Tony Waller  Procedure(s) Performed: LEFT KNEE ARTHROSCOPY WITH PARTIAL MEDIAL MENISCECTOMY, SUBCHONDROPLASTY AND EXCISION OF CYST (Left Knee)     Patient location during evaluation: PACU Anesthesia Type: General Level of consciousness: awake Pain management: pain level controlled Vital Signs Assessment: post-procedure vital signs reviewed and stable Respiratory status: spontaneous breathing Cardiovascular status: stable Postop Assessment: patient able to bend at knees Anesthetic complications: no    Last Vitals:  Vitals:   06/22/19 1230 06/22/19 1245  BP: 128/70 117/66  Pulse: 74 74  Resp: 17 18  Temp:    SpO2: 98% 97%    Last Pain:  Vitals:   06/22/19 1245  TempSrc:   PainSc: 0-No pain   Pain Goal: Patients Stated Pain Goal: 4 (06/22/19 0812)  LLE Motor Response: Purposeful movement (06/22/19 1245) LLE Sensation: Numbness (06/22/19 1245)            Huston Foley

## 2019-06-22 NOTE — Transfer of Care (Signed)
Immediate Anesthesia Transfer of Care Note  Patient: Tony Waller  Procedure(s) Performed: LEFT KNEE ARTHROSCOPY WITH PARTIAL MEDIAL MENISCECTOMY, SUBCHONDROPLASTY AND EXCISION OF CYST (Left Knee)  Patient Location: PACU  Anesthesia Type:General and Regional  Level of Consciousness: awake, alert  and oriented  Airway & Oxygen Therapy: Patient Spontanous Breathing and Patient connected to face mask oxygen  Post-op Assessment: Report given to RN and Post -op Vital signs reviewed and stable  Post vital signs: Reviewed and stable  Last Vitals:  Vitals Value Taken Time  BP    Temp    Pulse 79 06/22/19 1144  Resp 27 06/22/19 1144  SpO2 100 % 06/22/19 1144  Vitals shown include unvalidated device data.  Last Pain:  Vitals:   06/22/19 0812  TempSrc: Oral  PainSc: 0-No pain      Patients Stated Pain Goal: 4 (76/80/88 1103)  Complications: No apparent anesthesia complications

## 2019-06-22 NOTE — Discharge Instructions (Signed)
Post-operative patient instructions  Knee Arthroscopy    Ice:  Place intermittent ice or cooler pack over your knee, 30 minutes on and 30 minutes off.  Continue this for the first 72 hours after surgery, then save ice for use after therapy sessions or on more active days.    Weight:  You may bear weight on your leg as your symptoms allow.  Crutches:  Use crutches (or walker) to assist in walking until told to discontinue by your physical therapist or physician. This will help to reduce pain.  Strengthening:  Perform simple thigh squeezes (isometric quad contractions) and straight leg lifts as you are able (3 sets of 5 to 10 repetitions, 3 times a day).  For the leg lifts, have someone support under your ankle in the beginning until you have increased strength enough to do this on your own.  To help get started on thigh squeezes, place a pillow under your knee and push down on the pillow with back of knee (sometimes easier to do than with your leg fully straight).  Motion:  Perform gentle knee motion as tolerated - this is gentle bending and straightening of the knee. Seated heel slides: you can start by sitting in a chair, remove your brace, and gently slide your heel back on the floor - allowing your knee to bend. Have someone help you straighten your knee (or use your other leg/foot hooked under your ankle.   Dressing:  Perform 1st dressing change at 2 days postoperative. A moderate amount of blood tinged drainage is to be expected.  So if you bleed through the dressing on the first or second day or if you have fevers, it is fine to change the dressing/check the wounds early and redress wound. Elevate your leg.  If it bleeds through again, or if the incisions are leaking frank blood, please call the office. May change dressing every 1-2 days thereafter to help watch wounds. Can purchase Tegaderm (or 64M Nexcare) water resistant dressings at local pharmacy / [facility].  Shower:  Light shower is  ok after 2 days.  Please take shower, NO bath. Recover with gauze and ace wrap to help keep wounds protected.    Pain medication:  A narcotic pain medication has been prescribed.  Take as directed.  Typically you need narcotic pain medication more regularly during the first 3 to 5 days after surgery.  Decrease your use of the medication as the pain improves.  Narcotics can sometimes cause constipation, even after a few doses.  If you have problems with constipation, you can take an over the counter stool softener or light laxative.  If you have persistent problems, please notify your physicians office.  Physical therapy: Additional activity guidelines to be provided by your physician or physical therapist at follow-up visits.   Driving: Do not recommend driving x 2 weeks post surgical, especially if surgery performed on right side. Should not drive while taking narcotic pain medications. It typically takes at least 2 weeks to restore sufficient neuromuscular function for normal reaction times for driving safety.   Call [phone] for questions or problems. Evenings you will be forwarded to the hospital operator.  Ask for the orthopaedic physician on call. Please call if you experience:    o Redness, foul smelling, or persistent drainage from the surgical site  o worsening knee pain and swelling not responsive to medication  o any calf pain and or swelling of the lower leg  o temperatures greater than  101.5 F o other questions or concerns   Thank you for allowing [place] to be a part of your care.      Post Anesthesia Home Care Instructions  Activity: Get plenty of rest for the remainder of the day. A responsible individual must stay with you for 24 hours following the procedure.  For the next 24 hours, DO NOT: -Drive a car -Paediatric nurse -Drink alcoholic beverages -Take any medication unless instructed by your physician -Make any legal decisions or sign important  papers.  Meals: Start with liquid foods such as gelatin or soup. Progress to regular foods as tolerated. Avoid greasy, spicy, heavy foods. If nausea and/or vomiting occur, drink only clear liquids until the nausea and/or vomiting subsides. Call your physician if vomiting continues.  Special Instructions/Symptoms: Your throat may feel dry or sore from the anesthesia or the breathing tube placed in your throat during surgery. If this causes discomfort, gargle with warm salt water. The discomfort should disappear within 24 hours.  If you had a scopolamine patch placed behind your ear for the management of post- operative nausea and/or vomiting:  1. The medication in the patch is effective for 72 hours, after which it should be removed.  Wrap patch in a tissue and discard in the trash. Wash hands thoroughly with soap and water. 2. You may remove the patch earlier than 72 hours if you experience unpleasant side effects which may include dry mouth, dizziness or visual disturbances. 3. Avoid touching the patch. Wash your hands with soap and water after contact with the patch.       Regional Anesthesia Blocks  1. Numbness or the inability to move the "blocked" extremity may last from 3-48 hours after placement. The length of time depends on the medication injected and your individual response to the medication. If the numbness is not going away after 48 hours, call your surgeon.  2. The extremity that is blocked will need to be protected until the numbness is gone and the  Strength has returned. Because you cannot feel it, you will need to take extra care to avoid injury. Because it may be weak, you may have difficulty moving it or using it. You may not know what position it is in without looking at it while the block is in effect.  3. For blocks in the legs and feet, returning to weight bearing and walking needs to be done carefully. You will need to wait until the numbness is entirely gone and the  strength has returned. You should be able to move your leg and foot normally before you try and bear weight or walk. You will need someone to be with you when you first try to ensure you do not fall and possibly risk injury.  4. Bruising and tenderness at the needle site are common side effects and will resolve in a few days.  5. Persistent numbness or new problems with movement should be communicated to the surgeon or the [place] [phone] [place] [phone]).

## 2019-06-22 NOTE — Op Note (Signed)
Surgery Date: 06/22/2019  Surgeon(s): Leandrew Koyanagi, MD  ASSIST: Madalyn Rob, [place]; necessary for the timely completion of procedure and due to complexity of procedure.  ANESTHESIA:  general  FLUIDS: Per anesthesia record.   ESTIMATED BLOOD LOSS: minimal  PREOPERATIVE DIAGNOSES:  1. Left knee medial meniscus tear 2. Left knee synovitis 3. Left proximal tibia stress fracture  POSTOPERATIVE DIAGNOSES:  same  PROCEDURES PERFORMED:  1. Left knee arthroscopy with major synovectomy 2. Left knee arthroscopy with arthroscopic partial medial meniscectomy 3. Left knee arthroscopy with arthroscopic chondroplasty medial femoral condyle and medial tibial plateau, patella, femoral trochlea 4. Left knee arthroscopic assisted subchondroplasty of medial tibial plateau stress fracture  DESCRIPTION OF PROCEDURE: Tony Waller is a 48 y.o.-year-old male with left knee medial meniscus tear. Plans are to proceed with partial medial meniscectomy and diagnostic arthroscopy with debridement as indicated. Full discussion held regarding risks benefits alternatives and complications related surgical intervention. Conservative care options reviewed. All questions answered.  The patient was identified in the preoperative holding area and the operative extremity was marked. The patient was brought to the operating room and transferred to operating table in a supine position. Satisfactory general anesthesia was induced by anesthesiology.    Standard anterolateral, anteromedial arthroscopy portals were obtained. The anteromedial portal was obtained with a spinal needle for localization under direct visualization with subsequent diagnostic findings.   All incisions were made for knee arthroscopy portals.  Diagnostic knee arthroscopy with first performed.  Major synovectomy was performed in all 3 compartments using oscillating shaver.  We then placed the arthroscope in the medial compartment with the knee in  valgus.  Of note the patient was significantly tight from a ligamentous standpoint.  Once we got the adequate visualization I used a meniscus probe to identify the large radial tear in the posterior horn which was then treated with a partial medial meniscectomy using meniscus basket and oscillating shaver back to a stable border.  After this was done chondroplasty was performed for the medial compartment.  No other cruciates were stable.  Lateral compartment was essentially unremarkable.  The knee was then placed in extension and the gutters were checked for loose bodies.  He did also have a very tight patellofemoral compartment.  Limited patellar mobility.  He did exhibit grade III-IV chondromalacia on both the trochlear and the patellar size.  Excess fluid was then removed from the knee joint.  The C-arm was then brought in and the knee was placed on a radiolucent triangle.  Orthogonal x-rays were obtained and a spinal needle was used to localize the starting site for the subchondroplasty.  The drill was then advanced into the subchondral bone of the medial tibial plateau to the appropriate depth using fluoroscopic guidance.  Calcium sulfate cement was then injected into the defect and serial x-rays were taken to show filling of the medial tibial plateau stress fracture.  The cement was then allowed to harden.  The arthroscope was then placed back into the joint to confirm no intra-articular extravasation of the cement.  The incisions were then closed with interrupted nylon sutures.  Sterile dressings were applied.  Patient tolerated the procedure well had no immediate complications.  Suprapatellar pouch and gutters: moderate synovitis or debris. Patella chondral surface: Grade 3 Trochlear chondral surface: Grade 4 Patellofemoral tracking: normal Medial meniscus: large radial tear of posterior horn.  Medial femoral condyle weight bearing surface: Grade 3 Medial tibial plateau: Grade 3 Anterior cruciate  ligament:stable Posterior cruciate ligament:stable Lateral  meniscus: normal.   Lateral femoral condyle weight bearing surface: Grade 1 Lateral tibial plateau: Grade 1  DISPOSITION: The patient was awakened from general anesthetic, extubated, taken to the recovery room in medically stable condition, no apparent complications. The patient may be weightbearing as tolerated to the operative lower extremity.  Range of motion of right knee as tolerated.  Tony Cecil, MD [facility] 11:31 AM

## 2019-06-22 NOTE — Anesthesia Procedure Notes (Signed)
Anesthesia Regional Block: Adductor canal block   Pre-Anesthetic Checklist: ,, timeout performed, Correct Patient, Correct Site, Correct Laterality, Correct Procedure, Correct Position, site marked, Risks and benefits discussed,  Surgical consent,  Pre-op evaluation,  At surgeon's request and post-op pain management  Laterality: Lower and Left  Prep: chloraprep       Needles:  Injection technique: Single-shot  Needle Type: Echogenic Stimulator Needle     Needle Length: 10cm  Needle Gauge: 21   Needle insertion depth: 2.5 cm   Additional Needles:   Procedures:,,,, ultrasound used (permanent image in chart),,,,  Narrative:  Start time: 06/22/2019 9:00 AM End time: 06/22/2019 9:10 AM Injection made incrementally with aspirations every 5 mL.  Performed by: Personally  Anesthesiologist: Lyn Hollingshead, MD

## 2019-06-22 NOTE — Anesthesia Preprocedure Evaluation (Addendum)
Anesthesia Evaluation  Patient identified by MRN, date of birth, ID band Patient awake    Reviewed: Allergy & Precautions, NPO status , Patient's Chart, lab work & pertinent test results  Airway Mallampati: I       Dental no notable dental hx. (+) Teeth Intact   Pulmonary sleep apnea , former smoker,    Pulmonary exam normal breath sounds clear to auscultation       Cardiovascular negative cardio ROS Normal cardiovascular exam Rhythm:Regular Rate:Normal     Neuro/Psych negative neurological ROS  negative psych ROS   GI/Hepatic negative GI ROS, Neg liver ROS,   Endo/Other  negative endocrine ROS  Renal/GU negative Renal ROS  negative genitourinary   Musculoskeletal   Abdominal (+) + obese,   Peds  Hematology negative hematology ROS (+)   Anesthesia Other Findings   Reproductive/Obstetrics                             Anesthesia Physical Anesthesia Plan  ASA: II  Anesthesia Plan: General   Post-op Pain Management:  Regional for Post-op pain   Induction: Intravenous  PONV Risk Score and Plan: 2 and Ondansetron, Dexamethasone and Midazolam  Airway Management Planned: LMA  Additional Equipment: None  Intra-op Plan:   Post-operative Plan: Extubation in OR  Informed Consent: I have reviewed the patients History and Physical, chart, labs and discussed the procedure including the risks, benefits and alternatives for the proposed anesthesia with the patient or authorized representative who has indicated his/her understanding and acceptance.     Dental advisory given  Plan Discussed with: CRNA  Anesthesia Plan Comments:         Anesthesia Quick Evaluation

## 2019-06-22 NOTE — H&P (Signed)
PREOPERATIVE H&P  Chief Complaint: Left knee acute medial meniscal tear, cyst and stress fracture left tibia  HPI: Tony Waller is a 48 y.o. male who presents for surgical treatment of Left knee acute medial meniscal tear, cyst and stress fracture left tibia.  He denies any changes in medical history.  Past Medical History:  Diagnosis Date  . ALLERGIC RHINITIS   . OSA (obstructive sleep apnea)    Past Surgical History:  Procedure Laterality Date  . BUNIONECTOMY Left   . NASAL SINUS SURGERY  2000   for deviated septum and turbonate reduction  . TONSILLECTOMY  1994   Social History   Socioeconomic History  . Marital status: Married    Spouse name: Not on file  . Number of children: Not on file  . Years of education: Not on file  . Highest education level: Not on file  Occupational History  . Occupation: Scientist, product/process development: [employer]  . Financial resource strain: Not on file  . Food insecurity    Worry: Not on file    Inability: Not on file  . Transportation needs    Medical: Not on file    Non-medical: Not on file  Tobacco Use  . Smoking status: Former Smoker    Years: 9.00  . Smokeless tobacco: Never Used  Substance and Sexual Activity  . Alcohol use: Yes    Comment: occas  . Drug use: Never  . Sexual activity: Not on file  Lifestyle  . Physical activity    Days per week: Not on file    Minutes per session: Not on file  . Stress: Not on file  Relationships  . Social Herbalist on phone: Not on file    Gets together: Not on file    Attends religious service: Not on file    Active member of club or organization: Not on file    Attends meetings of clubs or organizations: Not on file    Relationship status: Not on file  Other Topics Concern  . Not on file  Social History Narrative   Married w/ children   Family History  Problem Relation Age of Onset  . Allergies Mother   . Heart disease Paternal Grandfather    . Lung cancer Paternal Grandfather   . Brain cancer Maternal Uncle    Allergies  Allergen Reactions  . Penicillins Hives   Prior to Admission medications   Medication Sig Start Date End Date Taking? Authorizing Provider  diclofenac sodium (VOLTAREN) 1 % GEL APPLY 4 GRAMS TO LEFT KNEE 4 TIMES A DAY AS NEEDED *DONT USE WITH IBUPROFEN* 01/13/19  Yes [provider]     Positive ROS: All other systems have been reviewed and were otherwise negative with the exception of those mentioned in the HPI and as above.  Physical Exam: General: Alert, no acute distress Cardiovascular: No pedal edema Respiratory: No cyanosis, no use of accessory musculature GI: abdomen soft Skin: No lesions in the area of chief complaint Neurologic: Sensation intact distally Psychiatric: Patient is competent for consent with normal mood and affect Lymphatic: no lymphedema  MUSCULOSKELETAL: exam stable  Assessment: Left knee acute medial meniscal tear, cyst and stress fracture left tibia  Plan: Plan for Procedure(s): LEFT KNEE ARTHROSCOPY WITH PARTIAL MEDIAL MENISCECTOMY, SUBCHONDROPLASTY AND EXCISION OF CYST  The risks benefits and alternatives were discussed with the patient including but not limited to the risks of nonoperative treatment, versus  surgical intervention including infection, bleeding, nerve injury,  blood clots, cardiopulmonary complications, morbidity, mortality, among others, and they were willing to proceed.   Eduard Roux, MD   06/22/2019 8:38 AM

## 2019-06-22 NOTE — Progress Notes (Signed)
Assisted Dr. Jillyn Hidden with left, ultrasound guided, adductor canal block. Side rails up, monitors on throughout procedure. See vital signs in flow sheet. Tolerated Procedure well.

## 2019-06-24 ENCOUNTER — Encounter (HOSPITAL_BASED_OUTPATIENT_CLINIC_OR_DEPARTMENT_OTHER): Payer: Self-pay | Admitting: Orthopaedic Surgery

## 2019-06-27 ENCOUNTER — Ambulatory Visit: Payer: 59 | Admitting: Podiatry

## 2019-06-29 ENCOUNTER — Other Ambulatory Visit: Payer: Self-pay

## 2019-06-29 ENCOUNTER — Ambulatory Visit (INDEPENDENT_AMBULATORY_CARE_PROVIDER_SITE_OTHER): Payer: 59 | Admitting: Orthopaedic Surgery

## 2019-06-29 ENCOUNTER — Encounter: Payer: Self-pay | Admitting: Orthopaedic Surgery

## 2019-06-29 DIAGNOSIS — S83242A Other tear of medial meniscus, current injury, left knee, initial encounter: Secondary | ICD-10-CM

## 2019-06-29 DIAGNOSIS — M84362A Stress fracture, left tibia, initial encounter for fracture: Secondary | ICD-10-CM

## 2019-06-29 NOTE — Progress Notes (Signed)
Patient ID: Tony Waller, male   DOB: 03-06-1971, 48 y.o.   MRN: [id number]  [name] is a very pleasant 48 year old gentleman who is 7 days status post left knee arthroscopy partial medial meniscectomy subchondroplasty.  He is doing well and states that he has already noticed an improvement.  He is ambulating without any assistive devices.  Physical exam shows well-healed surgical incisions.  No signs infection.  Trace effusion.  Range of motion to 95 degrees of flexion.  No medial joint line tenderness.  Right knee is doing very well today.  Sutures removed.  Home exercise provided today.  We also provided him with a pamphlet on viscosupplementation for the future if he is interested based on intraoperative findings of advanced chondromalacia.  We will recheck him in 5 weeks.

## 2019-08-03 ENCOUNTER — Ambulatory Visit (INDEPENDENT_AMBULATORY_CARE_PROVIDER_SITE_OTHER): Payer: Managed Care, Other (non HMO)

## 2019-08-03 ENCOUNTER — Ambulatory Visit (INDEPENDENT_AMBULATORY_CARE_PROVIDER_SITE_OTHER): Payer: Managed Care, Other (non HMO) | Admitting: Orthopaedic Surgery

## 2019-08-03 ENCOUNTER — Encounter: Payer: Self-pay | Admitting: Orthopaedic Surgery

## 2019-08-03 ENCOUNTER — Other Ambulatory Visit: Payer: Self-pay

## 2019-08-03 DIAGNOSIS — S83242D Other tear of medial meniscus, current injury, left knee, subsequent encounter: Secondary | ICD-10-CM

## 2019-08-03 MED ORDER — METHYLPREDNISOLONE ACETATE 40 MG/ML IJ SUSP
40.0000 mg | INTRAMUSCULAR | Status: AC | PRN
  Administered 2019-08-03: 40 mg via INTRA_ARTICULAR

## 2019-08-03 MED ORDER — BUPIVACAINE HCL 0.25 % IJ SOLN
2.0000 mL | INTRAMUSCULAR | Status: AC | PRN
  Administered 2019-08-03: 2 mL via INTRA_ARTICULAR

## 2019-08-03 MED ORDER — HYDROCODONE-ACETAMINOPHEN 5-325 MG PO TABS: 1.0000 | ORAL_TABLET | Freq: Two times a day (BID) | ORAL | 0 refills | Status: DC | PRN

## 2019-08-03 MED ORDER — LIDOCAINE HCL 1 % IJ SOLN
2.0000 mL | INTRAMUSCULAR | Status: AC | PRN
  Administered 2019-08-03: 2 mL

## 2019-08-03 NOTE — Progress Notes (Signed)
Post-Op Visit Note   Patient: Tony Waller           Date of Birth: 09/05/1970           MRN: [id number] Visit Date: 08/03/2019 PCP: Aura Dials, MD   Assessment & Plan:  Chief Complaint:  Chief Complaint  Patient presents with  . Left Knee - Pain   Visit Diagnoses:  1. Acute medial meniscus tear of left knee, subsequent encounter     Plan: Patient is a pleasant 48 year old gentleman who presents our clinic today 6 weeks status post left knee arthroscopic medial meniscectomy and subchondroplasty, date of surgery 06/22/2019.  He was doing well until this past Friday when he sustained a mechanical fall hyperflexing his left knee.  Since then he has had pain to the lateral patella.  Worse with flexion of the knee.  He is also having some pain at night as well.  He has been taking an occasional oxycodone which he had leftover from his initial surgery.  He denies any instability or mechanical symptoms.  Examination of the left knee shows a trace effusion.  No joint line tenderness.  He has moderate tenderness along the lateral patella.  No patellar apprehension.  Ligaments are stable.  He is neurovascular intact distally.  At this point, I am not concerned for acute fracture.  I believe he probably overloaded his lateral patella when he hyperflexed his knee.  We will inject the left knee with cortisone in hopes of settling things down.  We will follow up with [place] in 6 weeks time for recheck.  Follow-Up Instructions: Return in about 6 weeks (around 09/14/2019).   Orders:  Orders Placed This Encounter  Procedures  . XR KNEE 3 VIEW LEFT   Meds ordered this encounter  Medications  . HYDROcodone-acetaminophen (NORCO) 5-325 MG tablet    Sig: Take 1 tablet by mouth 2 (two) times daily as needed for moderate pain.    Dispense:  20 tablet    Refill:  0    Imaging: XR KNEE 3 VIEW LEFT  Result Date: 08/03/2019 X-rays demonstrate moderate tricompartmental joint space narrowing with  significant spurring of the patella.   PMFS History: Patient Active Problem List   Diagnosis Date Noted  . Acute medial meniscus tear of left knee 04/13/2019  . Stress fracture of left tibia 04/13/2019  . OBSTRUCTIVE SLEEP APNEA 05/28/2009  . ALLERGIC RHINITIS 05/28/2009   Past Medical History:  Diagnosis Date  . ALLERGIC RHINITIS   . OSA (obstructive sleep apnea)     Family History  Problem Relation Age of Onset  . Allergies Mother   . Heart disease Paternal Grandfather   . Lung cancer Paternal Grandfather   . Brain cancer Maternal Uncle     Past Surgical History:  Procedure Laterality Date  . BUNIONECTOMY Left   . KNEE ARTHROSCOPY WITH SUBCHONDROPLASTY Left 06/22/2019   Procedure: LEFT KNEE ARTHROSCOPY WITH PARTIAL MEDIAL MENISCECTOMY, SUBCHONDROPLASTY AND EXCISION OF CYST;  Surgeon: Leandrew Koyanagi, MD;  Location: [facility];  Service: Orthopedics;  Laterality: Left;  . NASAL SINUS SURGERY  2000   for deviated septum and turbonate reduction  . TONSILLECTOMY  1994   Social History   Occupational History  . Occupation: Scientist, product/process development: [employer]  Tobacco Use  . Smoking status: Former Smoker    Years: 9.00  . Smokeless tobacco: Never Used  Substance and Sexual Activity  . Alcohol use: Yes    Comment:  occas  . Drug use: Never  . Sexual activity: Not on file

## 2019-08-03 NOTE — Progress Notes (Signed)
   Procedure Note  Patient: Tony Waller             Date of Birth: 07/11/1971           MRN: [id number]             Visit Date: 08/03/2019  Procedures: Visit Diagnoses:  1. Acute medial meniscus tear of left knee, subsequent encounter     Large Joint Inj: L knee on 08/03/2019 1:38 PM Indications: pain Details: 22 G needle, anterolateral approach Medications: 2 mL lidocaine 1 %; 2 mL bupivacaine 0.25 %; 40 mg methylPREDNISolone acetate 40 MG/ML

## 2019-08-10 ENCOUNTER — Encounter: Payer: Self-pay | Admitting: Physician Assistant

## 2019-08-10 ENCOUNTER — Other Ambulatory Visit: Payer: Self-pay

## 2019-08-10 ENCOUNTER — Ambulatory Visit (INDEPENDENT_AMBULATORY_CARE_PROVIDER_SITE_OTHER): Payer: Managed Care, Other (non HMO) | Admitting: Orthopaedic Surgery

## 2019-08-10 DIAGNOSIS — G8929 Other chronic pain: Secondary | ICD-10-CM | POA: Diagnosis not present

## 2019-08-10 DIAGNOSIS — M25561 Pain in right knee: Secondary | ICD-10-CM

## 2019-08-10 DIAGNOSIS — M25562 Pain in left knee: Secondary | ICD-10-CM

## 2019-08-10 MED ORDER — LIDOCAINE HCL 1 % IJ SOLN
3.0000 mL | INTRAMUSCULAR | Status: AC | PRN
  Administered 2019-08-10: 3 mL

## 2019-08-10 MED ORDER — HYDROCODONE-ACETAMINOPHEN 5-325 MG PO TABS: 1.0000 | ORAL_TABLET | Freq: Every day | ORAL | 0 refills | Status: DC | PRN

## 2019-08-10 MED ORDER — HYLAN G-F 20 48 MG/6ML IX SOSY
48.0000 mg | PREFILLED_SYRINGE | INTRA_ARTICULAR | Status: AC | PRN
  Administered 2019-08-10: 48 mg via INTRA_ARTICULAR

## 2019-08-10 MED ORDER — BUPIVACAINE HCL 0.25 % IJ SOLN
0.6600 mL | INTRAMUSCULAR | Status: AC | PRN
  Administered 2019-08-10: .66 mL via INTRA_ARTICULAR

## 2019-08-10 NOTE — Progress Notes (Signed)
   Procedure Note  Patient: Stoy Fenn             Date of Birth: 1971/04/01           MRN: [id number]             Visit Date: 08/10/2019  Procedures: Visit Diagnoses:  1. Chronic pain of both knees     Large Joint Inj: bilateral knee on 08/10/2019 9:50 AM Indications: pain Details: 22 G needle, anterolateral approach Medications (Right): 0.66 mL bupivacaine 0.25 %; 3 mL lidocaine 1 %; 48 mg Hylan 48 MG/6ML Medications (Left): 0.66 mL bupivacaine 0.25 %; 3 mL lidocaine 1 %; 48 mg Hylan 48 MG/6ML

## 2019-08-11 ENCOUNTER — Telehealth: Payer: Self-pay

## 2019-08-11 NOTE — Telephone Encounter (Signed)
sure

## 2019-08-11 NOTE — Telephone Encounter (Signed)
Is this okay?

## 2019-08-11 NOTE — Telephone Encounter (Signed)
Robin with [facility] called wanting to know if Rx for Hydrocodone could be filled today instead of Saturday, 08/13/2019 due to patient traveling out of town.  Cb# for [facility] is [phone].  Please advise.  Thank you.

## 2019-08-11 NOTE — Telephone Encounter (Signed)
Called Tony Waller to let her  it was okay. She states patient did not want to wait.

## 2019-09-09 ENCOUNTER — Ambulatory Visit (INDEPENDENT_AMBULATORY_CARE_PROVIDER_SITE_OTHER): Payer: No Typology Code available for payment source | Admitting: Orthopaedic Surgery

## 2019-09-09 ENCOUNTER — Encounter: Payer: Self-pay | Admitting: Orthopaedic Surgery

## 2019-09-09 ENCOUNTER — Other Ambulatory Visit: Payer: Self-pay

## 2019-09-09 VITALS — Ht 74.0 in | Wt 281.0 lb

## 2019-09-09 DIAGNOSIS — S83242D Other tear of medial meniscus, current injury, left knee, subsequent encounter: Secondary | ICD-10-CM

## 2019-09-09 NOTE — Progress Notes (Signed)
Patient ID: Tony Waller, male   DOB: Sep 23, 1970, 49 y.o.   MRN: [id number]  Mr. Delahoussaye is 11 weeks status post left knee scope.  We performed bilateral Synvisc injections about a month ago and he has done very well from these injections.  He has returned back to work.  He really does not have any complaints other than some start up stiffness.  His surgical scars are fully healed.  He has excellent range of motion.  No joint effusion.  At this point we will see him back as needed.  He will call us if he needs another round of Synvisc injections.

## 2019-09-16 ENCOUNTER — Other Ambulatory Visit: Payer: Self-pay | Admitting: Podiatry

## 2019-09-16 ENCOUNTER — Encounter: Payer: Self-pay | Admitting: Podiatry

## 2019-09-16 ENCOUNTER — Other Ambulatory Visit: Payer: Self-pay

## 2019-09-16 ENCOUNTER — Ambulatory Visit (INDEPENDENT_AMBULATORY_CARE_PROVIDER_SITE_OTHER): Payer: No Typology Code available for payment source

## 2019-09-16 ENCOUNTER — Ambulatory Visit (INDEPENDENT_AMBULATORY_CARE_PROVIDER_SITE_OTHER): Payer: No Typology Code available for payment source | Admitting: Podiatry

## 2019-09-16 VITALS — Temp 97.2°F

## 2019-09-16 DIAGNOSIS — M779 Enthesopathy, unspecified: Secondary | ICD-10-CM

## 2019-09-16 DIAGNOSIS — M79671 Pain in right foot: Secondary | ICD-10-CM

## 2019-09-16 DIAGNOSIS — M21621 Bunionette of right foot: Secondary | ICD-10-CM

## 2019-09-21 NOTE — Progress Notes (Signed)
Subjective:   Patient ID: Tony Waller, male   DOB: 49 y.o.   MRN: [id number]   HPI Patient presents stating that the outside of the right foot has really started to hurt and it is making it difficult for him to wear shoe gear comfortably   ROS      Objective:  Physical Exam  Neurovascular status intact with inflammation pain around the fifth MPJ right with fluid buildup around the joint surface     Assessment:  Tailor's bunion deformity with inflammatory capsulitis fifth MPJ right     Plan:  Reviewed condition and continued conservative treatment even though surgical intervention may be necessary at one point in future.  Did sterile prep and injected around the fifth MPJ 3 mg Dexasone Kenalog 5 mg Xylocaine advised on wider shoes and reappoint to recheck

## 2019-12-06 ENCOUNTER — Encounter: Payer: Self-pay | Admitting: Orthopaedic Surgery

## 2019-12-06 ENCOUNTER — Other Ambulatory Visit: Payer: Self-pay

## 2019-12-06 ENCOUNTER — Ambulatory Visit (INDEPENDENT_AMBULATORY_CARE_PROVIDER_SITE_OTHER): Payer: 59 | Admitting: Orthopaedic Surgery

## 2019-12-06 DIAGNOSIS — M1712 Unilateral primary osteoarthritis, left knee: Secondary | ICD-10-CM

## 2019-12-06 MED ORDER — LIDOCAINE HCL 1 % IJ SOLN
2.0000 mL | INTRAMUSCULAR | Status: AC | PRN
  Administered 2019-12-06: 2 mL

## 2019-12-06 MED ORDER — METHYLPREDNISOLONE ACETATE 40 MG/ML IJ SUSP
40.0000 mg | INTRAMUSCULAR | Status: AC | PRN
  Administered 2019-12-06: 40 mg via INTRA_ARTICULAR

## 2019-12-06 MED ORDER — BUPIVACAINE HCL 0.5 % IJ SOLN
2.0000 mL | INTRAMUSCULAR | Status: AC | PRN
  Administered 2019-12-06: 2 mL via INTRA_ARTICULAR

## 2019-12-06 NOTE — Progress Notes (Signed)
   Office Visit Note   Patient: Tony Waller           Date of Birth: 09-16-1970           MRN: [id number] Visit Date: 12/06/2019              Requested by: Aura Dials, [address],  [place] [address] PCP: Aura Dials, MD   Assessment & Plan: Visit Diagnoses:  1. Primary osteoarthritis of left knee     Plan: Impression is left knee DJD with patellofemoral compartment the worst.  After discussion of treatment options patient would like to try cortisone injection.  Our plan is to alternate cortisone and Synvisc injections every 3 months.  Follow-up in about 3 months.  Follow-Up Instructions: Return if symptoms worsen or fail to improve.   Orders:  No orders of the defined types were placed in this encounter.  No orders of the defined types were placed in this encounter.     Procedures: Large Joint Inj: L knee on 12/06/2019 2:19 PM Details: 22 G needle Medications: 2 mL bupivacaine 0.5 %; 2 mL lidocaine 1 %; 40 mg methylPREDNISolone acetate 40 MG/ML Outcome: tolerated well, no immediate complications Patient was prepped and draped in the usual sterile fashion.       Clinical Data: No additional findings.   Subjective: Chief Complaint  Patient presents with  . Left Knee - Pain    Tony Waller returns today for follow-up of left knee DJD.  Synvisc injection gave about 3 months of relief.  He got about 3 months relief from prior cortisone injections as well.  He continues to use Voltaren gel and ibuprofen.  He is mainly patellofemoral pain.   Review of Systems   Objective: Vital Signs: There were no vitals taken for this visit.  Physical Exam  Ortho Exam Left knee shows no joint effusion.  2+ patellofemoral crepitus.  Fully healed surgical scars. Specialty Comments:  No specialty comments available.  Imaging: No results found.   PMFS History: Patient Active Problem List   Diagnosis Date Noted  . Acute medial meniscus tear of left knee  04/13/2019  . Stress fracture of left tibia 04/13/2019  . OBSTRUCTIVE SLEEP APNEA 05/28/2009  . ALLERGIC RHINITIS 05/28/2009   Past Medical History:  Diagnosis Date  . ALLERGIC RHINITIS   . OSA (obstructive sleep apnea)     Family History  Problem Relation Age of Onset  . Allergies Mother   . Heart disease Paternal Grandfather   . Lung cancer Paternal Grandfather   . Brain cancer Maternal Uncle     Past Surgical History:  Procedure Laterality Date  . BUNIONECTOMY Left   . KNEE ARTHROSCOPY WITH SUBCHONDROPLASTY Left 06/22/2019   Procedure: LEFT KNEE ARTHROSCOPY WITH PARTIAL MEDIAL MENISCECTOMY, SUBCHONDROPLASTY AND EXCISION OF CYST;  Surgeon: Leandrew Koyanagi, MD;  Location: [facility];  Service: Orthopedics;  Laterality: Left;  . NASAL SINUS SURGERY  2000   for deviated septum and turbonate reduction  . TONSILLECTOMY  1994   Social History   Occupational History  . Occupation: Scientist, product/process development: Theme park manager  Tobacco Use  . Smoking status: Former Smoker    Years: 9.00  . Smokeless tobacco: Never Used  Substance and Sexual Activity  . Alcohol use: Yes    Comment: occas  . Drug use: Never  . Sexual activity: Not on file

## 2020-01-11 ENCOUNTER — Encounter: Payer: Self-pay | Admitting: Podiatry

## 2020-01-11 ENCOUNTER — Other Ambulatory Visit: Payer: Self-pay

## 2020-01-11 ENCOUNTER — Ambulatory Visit (INDEPENDENT_AMBULATORY_CARE_PROVIDER_SITE_OTHER): Payer: No Typology Code available for payment source | Admitting: Podiatry

## 2020-01-11 VITALS — Temp 97.8°F

## 2020-01-11 DIAGNOSIS — M722 Plantar fascial fibromatosis: Secondary | ICD-10-CM | POA: Diagnosis not present

## 2020-01-11 NOTE — Progress Notes (Signed)
Subjective:   Patient ID: Tony Waller, male   DOB: 49 y.o.   MRN: [id number]   HPI Patient states he had some knee surgery and his heel has become flared up left knee   ROS      Objective:  Physical Exam  Neurovascular status intact with significant flatfoot deformity with exquisite discomfort plantar aspect left heel     Assessment:  Acute plantar fasciitis left with inflammation     Plan:  Sterile prep injected the left plantar fascia 3 mg Kenalog 5 mg Xylocaine and advised on stretching exercises support therapy.  Patient will be seen back to recheck again

## 2020-02-02 ENCOUNTER — Telehealth: Payer: Self-pay | Admitting: Orthopaedic Surgery

## 2020-02-02 MED ORDER — TRAMADOL HCL 50 MG PO TABS: 50.0000 mg | ORAL_TABLET | Freq: Every day | ORAL | 0 refills | Status: DC | PRN

## 2020-02-02 NOTE — Telephone Encounter (Signed)
Noted  

## 2020-02-02 NOTE — Telephone Encounter (Signed)
Can you submit gel inj thanks.

## 2020-02-02 NOTE — Telephone Encounter (Signed)
Patient called advised he would like to get the Synvisc one injection. Patient also asked if he can get something for pain until the injection? The number to contact patient is [phone]

## 2020-02-02 NOTE — Telephone Encounter (Signed)
Patient aware.

## 2020-02-02 NOTE — Telephone Encounter (Signed)
Yes we can do Synvisc.  I sent tramadol

## 2020-02-15 ENCOUNTER — Telehealth: Payer: Self-pay

## 2020-02-15 NOTE — Telephone Encounter (Signed)
Submitted VOB, SynviscOne, bilateral knee.

## 2020-02-17 ENCOUNTER — Other Ambulatory Visit: Payer: Self-pay

## 2020-02-17 ENCOUNTER — Telehealth: Payer: Self-pay | Admitting: Orthopaedic Surgery

## 2020-02-17 MED ORDER — TRAMADOL HCL 50 MG PO TABS: 50.0000 mg | ORAL_TABLET | Freq: Every day | ORAL | 0 refills | Status: DC | PRN

## 2020-02-17 NOTE — Telephone Encounter (Signed)
Patient called requesting a refill on tramadol and also an update of approval from insurance company for injections. Please send refill to pharmacy on file and call patient about updates on approval. Patient phone number is [phone].

## 2020-02-17 NOTE — Telephone Encounter (Signed)
Ok to refill tramadol with same instructions as previous rx

## 2020-02-17 NOTE — Telephone Encounter (Signed)
Called in.

## 2020-02-17 NOTE — Telephone Encounter (Signed)
Can you advise on refill and send back and I will let him know about injection status

## 2020-02-20 ENCOUNTER — Telehealth: Payer: Self-pay

## 2020-02-20 NOTE — Telephone Encounter (Signed)
PA required for SynviscOne, bilateral knee. Faxed completed PA form to [facility] at [phone].

## 2020-02-22 ENCOUNTER — Encounter: Payer: Self-pay | Admitting: Podiatry

## 2020-02-22 ENCOUNTER — Ambulatory Visit (INDEPENDENT_AMBULATORY_CARE_PROVIDER_SITE_OTHER): Payer: No Typology Code available for payment source | Admitting: Podiatry

## 2020-02-22 ENCOUNTER — Other Ambulatory Visit: Payer: Self-pay

## 2020-02-22 VITALS — Temp 97.2°F

## 2020-02-22 DIAGNOSIS — M779 Enthesopathy, unspecified: Secondary | ICD-10-CM

## 2020-02-22 DIAGNOSIS — M722 Plantar fascial fibromatosis: Secondary | ICD-10-CM

## 2020-02-22 DIAGNOSIS — M21621 Bunionette of right foot: Secondary | ICD-10-CM

## 2020-02-22 NOTE — Progress Notes (Signed)
Subjective:   Patient ID: Marcille Buffy, male   DOB: 49 y.o.   MRN: [id number]   HPI Patient states my left heel is still quite intensely sore and I am getting trouble again around the right fifth metatarsal bone  Get any surgery on that.  I would like to try to wait several more months and I am looking for something short-term to try to help   ROS      Objective:  Physical Exam  Neurovascular status was found to be intact with exquisite discomfort left plantar fascia at the insertional point of the tendon into the calcaneus with the right fifth metatarsal head shown to be prominent with inflammation fluid around the head of the metatarsal     Assessment:  Inflammatory capsulitis of the fifth MPJ right along with acute inflammation plantar fascial condition left heel     Plan:  H&P reviewed both conditions and discussed metatarsal osteotomy for the right.  He needs to wait several months and I did do a careful steroid injection around the fifth MPJ and then for the left heel due to the intensity of discomfort I did reinject the fascia 3 mg Kenalog 5 mg Xylocaine after sterile prep of the area and then applied air fracture walker with all instructions on usage.  Patient will be seen back to recheck and I also trimmed the lesion on the right and he will have metatarsal osteotomy fifth right we will schedule that and will be seen back to discuss prior to procedure

## 2020-02-28 ENCOUNTER — Telehealth: Payer: Self-pay

## 2020-02-28 NOTE — Telephone Encounter (Signed)
Patient is aware that he is approved for gel injection.  Approved, SynviscOne, bilateral knee. Buy & Bill Must meet deductible first Patient will be responsible for 20% OOP. No Co-pay PA required PA Approval# [id number] Valid 02/25/2020-02/23/2021

## 2020-03-02 ENCOUNTER — Telehealth: Payer: Self-pay | Admitting: Orthopaedic Surgery

## 2020-03-02 ENCOUNTER — Other Ambulatory Visit: Payer: Self-pay | Admitting: Orthopaedic Surgery

## 2020-03-02 NOTE — Telephone Encounter (Signed)
Please advise 

## 2020-03-02 NOTE — Telephone Encounter (Signed)
Pt called stating he would like a refill of pain medicine but he would like something stronger than the tramadol.   [phone]

## 2020-03-02 NOTE — Telephone Encounter (Signed)
Tramadol is the strongest we can do.  Sorry.

## 2020-03-02 NOTE — Telephone Encounter (Signed)
Pt has been approved for bilateral knee Synvisc one injections. Until injection he is requesting pain medication and states that he would like something stronger than tramadol. Please advise.

## 2020-03-05 NOTE — Telephone Encounter (Signed)
Ubable to reach pt please see message below.

## 2020-03-07 ENCOUNTER — Ambulatory Visit (INDEPENDENT_AMBULATORY_CARE_PROVIDER_SITE_OTHER): Payer: No Typology Code available for payment source

## 2020-03-07 ENCOUNTER — Ambulatory Visit (INDEPENDENT_AMBULATORY_CARE_PROVIDER_SITE_OTHER): Payer: No Typology Code available for payment source | Admitting: Orthopaedic Surgery

## 2020-03-07 ENCOUNTER — Encounter: Payer: Self-pay | Admitting: Orthopaedic Surgery

## 2020-03-07 DIAGNOSIS — M1712 Unilateral primary osteoarthritis, left knee: Secondary | ICD-10-CM

## 2020-03-07 DIAGNOSIS — M1711 Unilateral primary osteoarthritis, right knee: Secondary | ICD-10-CM

## 2020-03-07 DIAGNOSIS — M25571 Pain in right ankle and joints of right foot: Secondary | ICD-10-CM

## 2020-03-07 MED ORDER — HYLAN G-F 20 48 MG/6ML IX SOSY
48.0000 mg | PREFILLED_SYRINGE | INTRA_ARTICULAR | Status: AC | PRN
  Administered 2020-03-07: 48 mg via INTRA_ARTICULAR

## 2020-03-07 MED ORDER — LIDOCAINE HCL 1 % IJ SOLN
2.0000 mL | INTRAMUSCULAR | Status: AC | PRN
  Administered 2020-03-07: 2 mL

## 2020-03-07 MED ORDER — MELOXICAM 7.5 MG PO TABS: 7.5000 mg | ORAL_TABLET | Freq: Two times a day (BID) | ORAL | 2 refills | Status: DC | PRN

## 2020-03-07 NOTE — Progress Notes (Signed)
Office Visit Note   Patient: Tony Waller           Date of Birth: November 06, 1970           MRN: [id number] Visit Date: 03/07/2020              Requested by: Aura Dials, [address],  [place] [address] PCP: Aura Dials, MD   Assessment & Plan: Visit Diagnoses:  1. Pain in right ankle and joints of right foot   2. Primary osteoarthritis of left knee   3. Primary osteoarthritis of right knee     Plan: Bilateral Synvisc injections performed today.  In terms of the right ankle he may have a chronically torn ATFL from previous injuries although he does not report any recent ones.  Based on discussion he would like to try meloxicam and an over-the-counter ankle stabilizing brace.  We will see him back as needed.  Follow-Up Instructions: Return if symptoms worsen or fail to improve.   Orders:  Orders Placed This Encounter  Procedures  . XR Ankle Complete Right   Meds ordered this encounter  Medications  . meloxicam (MOBIC) 7.5 MG tablet    Sig: Take 1 tablet (7.5 mg total) by mouth 2 (two) times daily as needed for pain.    Dispense:  30 tablet    Refill:  2      Procedures: Large Joint Inj: bilateral knee on 03/07/2020 6:36 PM Indications: pain Details: 22 G needle  Arthrogram: No  Medications (Right): 2 mL lidocaine 1 %; 48 mg Hylan 48 MG/6ML Medications (Left): 2 mL lidocaine 1 %; 48 mg Hylan 48 MG/6ML Outcome: tolerated well, no immediate complications Patient was prepped and draped in the usual sterile fashion.       Clinical Data: No additional findings.   Subjective: Chief Complaint  Patient presents with  . Left Knee - Pain  . Right Knee - Pain  . Right Ankle - Pain    Right knee comes in for bilateral Synvisc injections.  He is also asking to be evaluated for a separate issue of right ankle pain.  Denies any injuries.  He has noticed some swelling over the anterolateral aspect of the ankle.  Denies any numbness and tingling.   Review  of Systems   Objective: Vital Signs: There were no vitals taken for this visit.  Physical Exam  Ortho Exam Right ankle shows mild swelling over the ATFL.  Peroneal tendons are nontender.  No warmth or temperature changes.  Negative anterior drawer. Specialty Comments:  No specialty comments available.  Imaging: XR Ankle Complete Right  Result Date: 03/07/2020 Negative for acute findings.    PMFS History: Patient Active Problem List   Diagnosis Date Noted  . Acute medial meniscus tear of left knee 04/13/2019  . Stress fracture of left tibia 04/13/2019  . OBSTRUCTIVE SLEEP APNEA 05/28/2009  . ALLERGIC RHINITIS 05/28/2009   Past Medical History:  Diagnosis Date  . ALLERGIC RHINITIS   . OSA (obstructive sleep apnea)     Family History  Problem Relation Age of Onset  . Allergies Mother   . Heart disease Paternal Grandfather   . Lung cancer Paternal Grandfather   . Brain cancer Maternal Uncle     Past Surgical History:  Procedure Laterality Date  . BUNIONECTOMY Left   . KNEE ARTHROSCOPY WITH SUBCHONDROPLASTY Left 06/22/2019   Procedure: LEFT KNEE ARTHROSCOPY WITH PARTIAL MEDIAL MENISCECTOMY, SUBCHONDROPLASTY AND EXCISION OF CYST;  Surgeon:  Leandrew Koyanagi, MD;  Location: [facility];  Service: Orthopedics;  Laterality: Left;  . NASAL SINUS SURGERY  2000   for deviated septum and turbonate reduction  . TONSILLECTOMY  1994   Social History   Occupational History  . Occupation: Scientist, product/process development: Theme park manager  Tobacco Use  . Smoking status: Former Smoker    Years: 9.00  . Smokeless tobacco: Never Used  Vaping Use  . Vaping Use: Never used  Substance and Sexual Activity  . Alcohol use: Yes    Comment: occas  . Drug use: Never  . Sexual activity: Not on file

## 2020-04-21 ENCOUNTER — Other Ambulatory Visit: Payer: Self-pay | Admitting: Orthopaedic Surgery

## 2020-04-21 DIAGNOSIS — M1712 Unilateral primary osteoarthritis, left knee: Secondary | ICD-10-CM

## 2020-04-21 DIAGNOSIS — M1711 Unilateral primary osteoarthritis, right knee: Secondary | ICD-10-CM

## 2020-04-21 DIAGNOSIS — M25571 Pain in right ankle and joints of right foot: Secondary | ICD-10-CM

## 2020-05-07 ENCOUNTER — Encounter: Payer: Self-pay | Admitting: Physician Assistant

## 2020-05-07 ENCOUNTER — Ambulatory Visit (INDEPENDENT_AMBULATORY_CARE_PROVIDER_SITE_OTHER): Payer: No Typology Code available for payment source | Admitting: Orthopaedic Surgery

## 2020-05-07 ENCOUNTER — Ambulatory Visit (INDEPENDENT_AMBULATORY_CARE_PROVIDER_SITE_OTHER): Payer: No Typology Code available for payment source

## 2020-05-07 ENCOUNTER — Ambulatory Visit: Payer: Self-pay

## 2020-05-07 DIAGNOSIS — M25512 Pain in left shoulder: Secondary | ICD-10-CM
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
## 2020-05-07 NOTE — Progress Notes (Signed)
Subjective: Patient is here for ultrasound-guided intra-articular left AC joint injection.   Objective:  Tender over the [facility] joint.  Procedure: Ultrasound-guided left ACJ injection: After sterile prep with Betadine, injected 4 cc 1% lidocaine without epinephrine and 40 mg methylprednisolone into the ACJ without complication.  Good immediate relief.

## 2020-06-01 ENCOUNTER — Ambulatory Visit (INDEPENDENT_AMBULATORY_CARE_PROVIDER_SITE_OTHER): Payer: No Typology Code available for payment source | Admitting: Orthopaedic Surgery

## 2020-06-01 ENCOUNTER — Ambulatory Visit (INDEPENDENT_AMBULATORY_CARE_PROVIDER_SITE_OTHER): Payer: No Typology Code available for payment source

## 2020-06-01 ENCOUNTER — Other Ambulatory Visit: Payer: Self-pay

## 2020-06-01 ENCOUNTER — Encounter: Payer: Self-pay | Admitting: Orthopaedic Surgery

## 2020-06-01 DIAGNOSIS — M1712 Unilateral primary osteoarthritis, left knee: Secondary | ICD-10-CM | POA: Diagnosis not present

## 2020-06-01 MED ORDER — MELOXICAM 7.5 MG PO TABS: 7.5000 mg | ORAL_TABLET | Freq: Every day | ORAL | 2 refills | Status: DC | PRN

## 2020-06-01 MED ORDER — METHYLPREDNISOLONE ACETATE 40 MG/ML IJ SUSP
40.0000 mg | INTRAMUSCULAR | Status: AC | PRN
  Administered 2020-06-01: 40 mg via INTRA_ARTICULAR

## 2020-06-01 MED ORDER — BUPIVACAINE HCL 0.25 % IJ SOLN
2.0000 mL | INTRAMUSCULAR | Status: AC | PRN
  Administered 2020-06-01: 2 mL via INTRA_ARTICULAR

## 2020-06-01 MED ORDER — LIDOCAINE HCL 1 % IJ SOLN
2.0000 mL | INTRAMUSCULAR | Status: AC | PRN
  Administered 2020-06-01: 2 mL

## 2020-06-01 NOTE — Progress Notes (Signed)
Office Visit Note   Patient: Tony Waller           Date of Birth: 1970/08/18           MRN: [id number] Visit Date: 06/01/2020              Requested by: Aura Dials, [address] [place],  [place] [address] PCP: Aura Dials, MD   Assessment & Plan: Visit Diagnoses:  1. Primary osteoarthritis of left knee     Plan: Impression is left knee advanced degenerative joint disease.  We have discussed repeating cortisone injection today which the patient would like to proceed with.  He will call us in a few months to ask for approval for viscosupplementation injection.  He will follow up with [place] as needed.  Follow-Up Instructions: Return if symptoms worsen or fail to improve.   Orders:  Orders Placed This Encounter  Procedures   Large Joint Inj: L knee   XR KNEE 3 VIEW LEFT   Meds ordered this encounter  Medications   meloxicam (MOBIC) 7.5 MG tablet    Sig: Take 1 tablet (7.5 mg total) by mouth daily as needed for up to 30 doses for pain.    Dispense:  30 tablet    Refill:  2      Procedures: Large Joint Inj: L knee on 06/01/2020 9:51 AM Indications: pain Details: 22 G needle, anterolateral approach Medications: 2 mL lidocaine 1 %; 2 mL bupivacaine 0.25 %; 40 mg methylPREDNISolone acetate 40 MG/ML      Clinical Data: No additional findings.   Subjective: Chief Complaint  Patient presents with   Left Knee - Pain    HPI patient is a very pleasant 49 year old gentleman who comes in today with recurrent left knee pain.  History of advanced tricompartmental degenerative changes.  He has been seeing [place] for this where he has had intermittent cortisone and viscosupplementation injections.  His last injection was gel injection which was on 03/07/2020.  Had good relief of symptoms until recently.  His pain has returned and is starting to worsen.  He has pain with increased activity to include squatting and mowing his yard.  He does take Mobic and ice his knee with  mild relief of symptoms.  Review of Systems as detailed in HPI.  All others reviewed and are negative.   Objective: Vital Signs: There were no vitals taken for this visit.  Physical Exam well-developed well-nourished gentleman in no acute distress.  Alert oriented x3.  Ortho Exam left knee exam shows a trace effusion.  Range of motion 0 to 100 degrees.  No joint line tenderness.  Moderate patellofemoral crepitus.  He is neurovascular intact distally.  Specialty Comments:  No specialty comments available.  Imaging: XR KNEE 3 VIEW LEFT  Result Date: 06/01/2020 Advanced tricompartmental degenerative changes    PMFS History: Patient Active Problem List   Diagnosis Date Noted   Acute medial meniscus tear of left knee 04/13/2019   Stress fracture of left tibia 04/13/2019   OBSTRUCTIVE SLEEP APNEA 05/28/2009   ALLERGIC RHINITIS 05/28/2009   Past Medical History:  Diagnosis Date   ALLERGIC RHINITIS    OSA (obstructive sleep apnea)     Family History  Problem Relation Age of Onset   Allergies Mother    Heart disease Paternal Grandfather    Lung cancer Paternal Grandfather    Brain cancer Maternal Uncle     Past Surgical History:  Procedure Laterality Date  BUNIONECTOMY Left    KNEE ARTHROSCOPY WITH SUBCHONDROPLASTY Left 06/22/2019   Procedure: LEFT KNEE ARTHROSCOPY WITH PARTIAL MEDIAL MENISCECTOMY, SUBCHONDROPLASTY AND EXCISION OF CYST;  Surgeon: Leandrew Koyanagi, MD;  Location: [facility];  Service: Orthopedics;  Laterality: Left;   NASAL SINUS SURGERY  2000   for deviated septum and turbonate reduction   TONSILLECTOMY  1994   Social History   Occupational History   Occupation: Scientist, product/process development: Theme park manager  Tobacco Use   Smoking status: Former Smoker    Years: 9.00   Smokeless tobacco: Never Used  Scientific laboratory technician Use: Never used  Substance and Sexual Activity   Alcohol use: Yes    Comment: occas   Drug  use: Never   Sexual activity: Not on file

## 2020-07-11 ENCOUNTER — Telehealth: Payer: Self-pay | Admitting: Orthopaedic Surgery

## 2020-07-11 NOTE — Telephone Encounter (Signed)
Patient called. He would like to get gel injections. His call back number is [phone]

## 2020-07-11 NOTE — Telephone Encounter (Signed)
Lvm for pt to call back. Pt will need to contact us back at the first of the year.

## 2020-07-11 NOTE — Telephone Encounter (Signed)
Can you advise please

## 2020-07-11 NOTE — Telephone Encounter (Signed)
Next available gel injection would need to be after 09/07/2020 due to last SynviscOne, Bilateral knee injections being done on 03/07/2020.

## 2020-07-26 ENCOUNTER — Ambulatory Visit (INDEPENDENT_AMBULATORY_CARE_PROVIDER_SITE_OTHER): Payer: No Typology Code available for payment source | Admitting: Podiatry

## 2020-07-26 ENCOUNTER — Ambulatory Visit (INDEPENDENT_AMBULATORY_CARE_PROVIDER_SITE_OTHER): Payer: No Typology Code available for payment source

## 2020-07-26 ENCOUNTER — Encounter: Payer: Self-pay | Admitting: Podiatry

## 2020-07-26 ENCOUNTER — Other Ambulatory Visit: Payer: Self-pay

## 2020-07-26 DIAGNOSIS — M79671 Pain in right foot: Secondary | ICD-10-CM

## 2020-07-26 DIAGNOSIS — M79672 Pain in left foot: Secondary | ICD-10-CM

## 2020-07-26 DIAGNOSIS — M21621 Bunionette of right foot: Secondary | ICD-10-CM

## 2020-07-26 DIAGNOSIS — M778 Other enthesopathies, not elsewhere classified: Secondary | ICD-10-CM | POA: Diagnosis not present

## 2020-07-26 MED ORDER — TRIAMCINOLONE ACETONIDE 10 MG/ML IJ SUSP
10.0000 mg | Freq: Once | INTRAMUSCULAR | Status: AC
  Administered 2020-07-26: 10 mg

## 2020-07-30 NOTE — Progress Notes (Signed)
Subjective:   Patient ID: Tony Waller, male   DOB: 49 y.o.   MRN: [id number]   HPI Patient presents stating my left forearm on the top has been sore and I do not know what happened.  States is been bothering him for the last month or 2 and he does not remember specifically what he may have done.   ROS      Objective:  Physical Exam  Neurovascular status intact with inflammation lateral side left foot near the insertion of the peroneal tendon base of fifth metatarsal with fluid buildup     Assessment:  Peroneal tendinitis left with inflammation     Plan:  H&P reviewed condition sterile prep done and did inject the tendon complex 3 mg Dexasone Kenalog 5 mg Xylocaine advised on aggressive ice and reappoint to recheck  X-rays were negative for signs of fracture or indications that there is an arthritic process associated with this inflammation

## 2020-07-31 ENCOUNTER — Ambulatory Visit: Payer: No Typology Code available for payment source | Admitting: Orthopaedic Surgery

## 2020-08-06 ENCOUNTER — Other Ambulatory Visit: Payer: Self-pay | Admitting: Podiatry

## 2020-08-06 DIAGNOSIS — M778 Other enthesopathies, not elsewhere classified: Secondary | ICD-10-CM

## 2020-08-07 ENCOUNTER — Ambulatory Visit: Payer: No Typology Code available for payment source | Admitting: Orthopaedic Surgery

## 2020-08-07 ENCOUNTER — Ambulatory Visit (INDEPENDENT_AMBULATORY_CARE_PROVIDER_SITE_OTHER): Payer: No Typology Code available for payment source | Admitting: Orthopaedic Surgery

## 2020-08-07 ENCOUNTER — Encounter: Payer: Self-pay | Admitting: Orthopaedic Surgery

## 2020-08-07 DIAGNOSIS — M19019 Primary osteoarthritis, unspecified shoulder: Secondary | ICD-10-CM

## 2020-08-07 MED ORDER — TRAMADOL HCL 50 MG PO TABS: 50.0000 mg | ORAL_TABLET | Freq: Three times a day (TID) | ORAL | 1 refills | Status: DC | PRN
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
## 2020-08-14 ENCOUNTER — Other Ambulatory Visit: Payer: Self-pay

## 2020-08-14 ENCOUNTER — Ambulatory Visit
Admission: RE | Admit: 2020-08-14 | Discharge: 2020-08-14 | Disposition: A | Discharge status: Home / Self Care | Payer: No Typology Code available for payment source | Source: Ambulatory Visit | Attending: Orthopaedic Surgery | Admitting: Orthopaedic Surgery

## 2020-08-14 DIAGNOSIS — M19019 Primary osteoarthritis, unspecified shoulder: Secondary | ICD-10-CM

## 2020-08-14 IMAGING — MR MR SHOULDER*L* W/O CM
5 series · 35 of 40 positions shown · non-contrast
Comparison: None.

CLINICAL DATA: Left shoulder pain which is worse with weight
lifting for approximately 3 weeks.

EXAM:
MRI OF THE LEFT SHOULDER WITHOUT CONTRAST
TECHNIQUE: Multiplanar, multisequence MR imaging of the shoulder was performed.
No intravenous contrast was administered.

[Series 3: T2 fat-sat · axial · 4.0mm · 0.62mm/px · z∈[-61,+56]mm · 8 of 26 slices shown (1 of 3)]
[im 1/26]
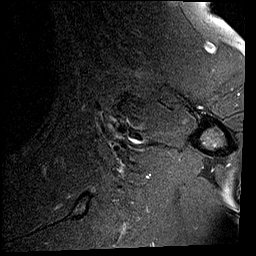
[im 3/26]
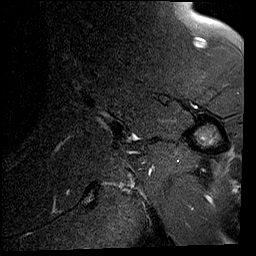
[im 9/26]
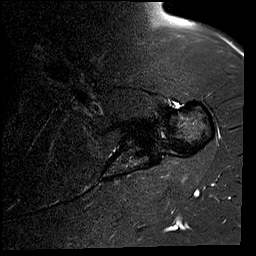
[im 12/26]
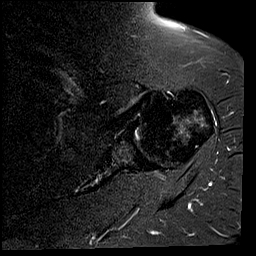
[im 14/26]
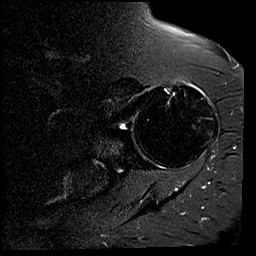
[im 17/26]
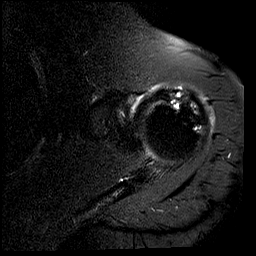
[im 23/26]
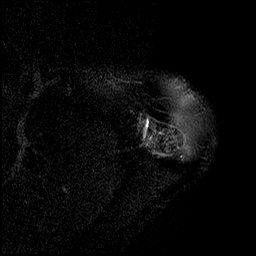
[im 26/26]
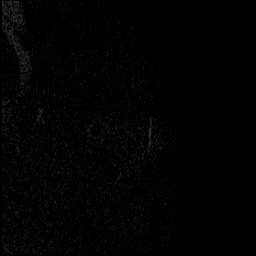

[Series 4: T1 · oblique · 4.0mm · 0.27mm/px · 5 of 23 slices shown]
[im 1/23]
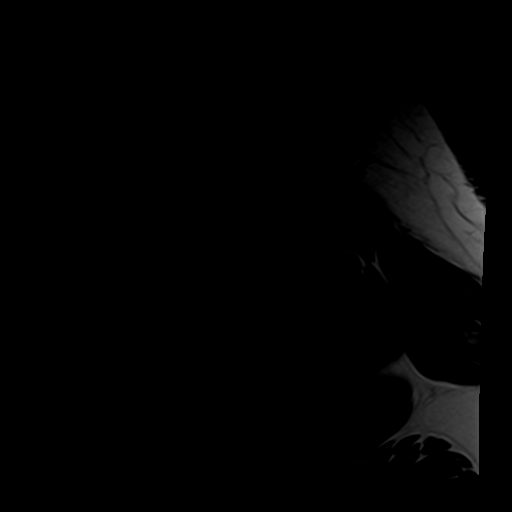
[im 4/23]
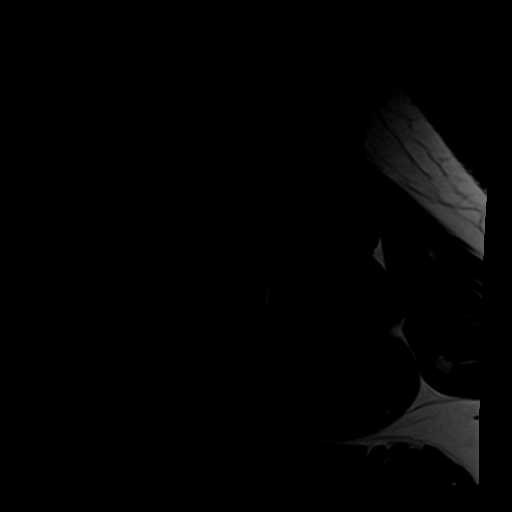
[im 7/23]
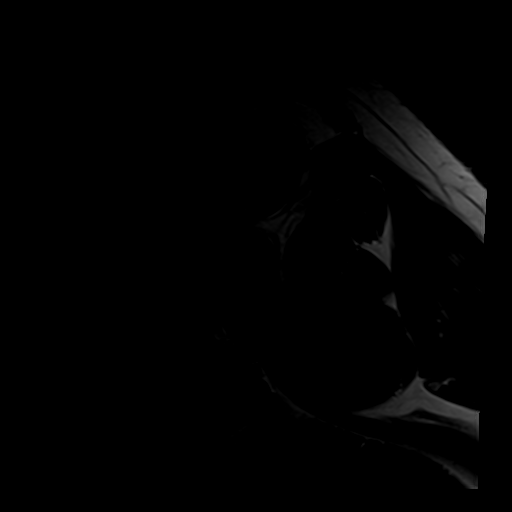
[im 10/23]
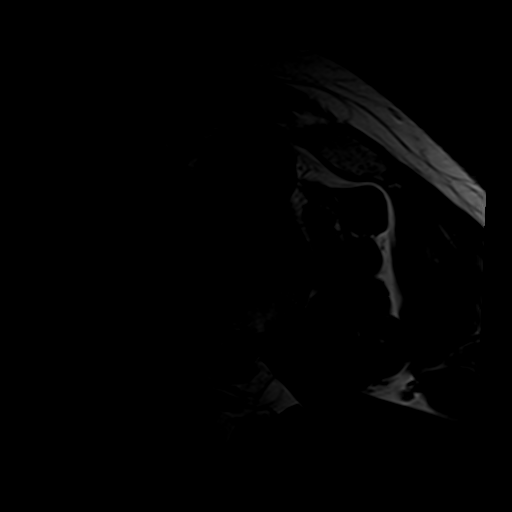
[im 13/23]
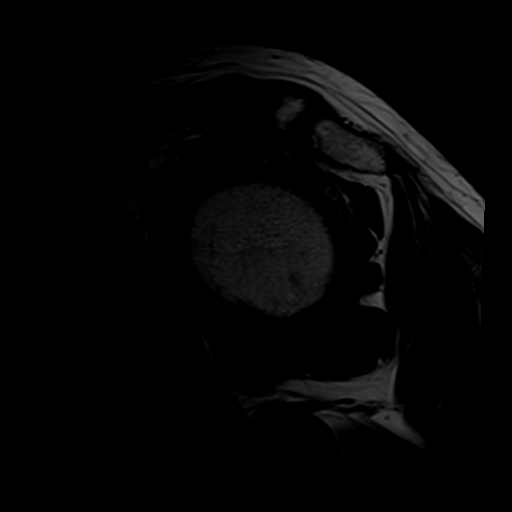

[Series 5: T2 fat-sat · oblique · 4.0mm · 0.55mm/px · 8 of 22 slices shown (2 of 3)]
[im 1/22]
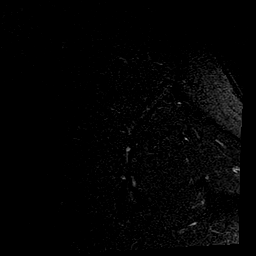
[im 4/22]
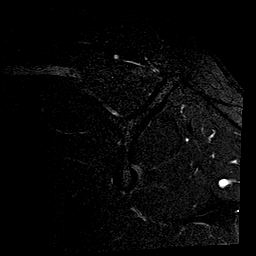
[im 7/22]
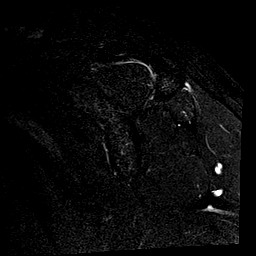
[im 10/22]
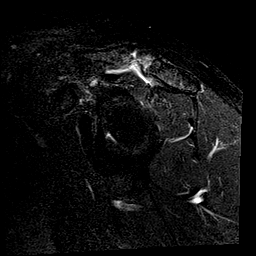
[im 13/22]
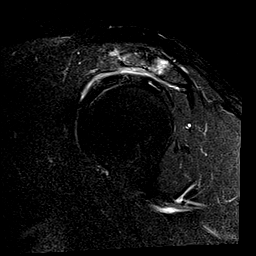
[im 16/22]
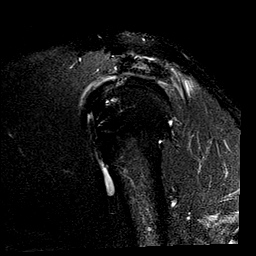
[im 19/22]
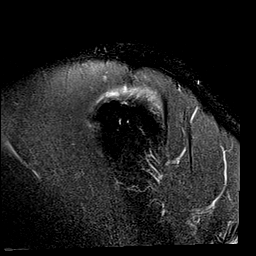
[im 22/22]
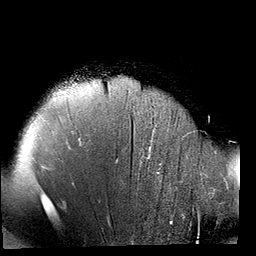

[Series 6: T2 fat-sat · oblique · 4.0mm · 0.55mm/px · 7 of 19 slices shown (3 of 3)]
[im 1/19]
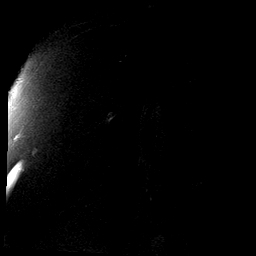
[im 4/19]
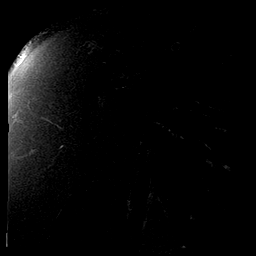
[im 7/19]
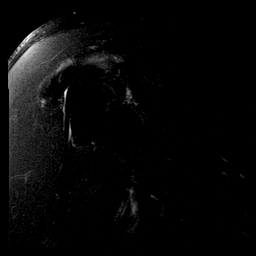
[im 10/19]
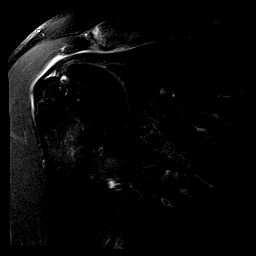
[im 13/19]
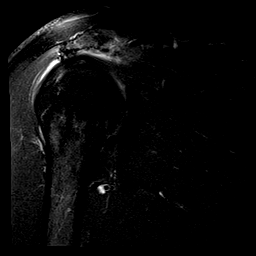
[im 16/19]
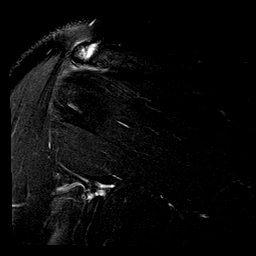
[im 19/19]
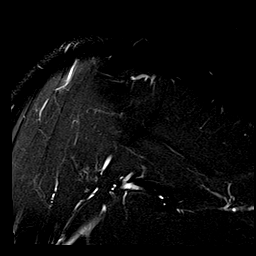

[Series 7: PD · oblique · 4.0mm · 0.27mm/px · 7 of 19 slices shown]
[im 1/19]
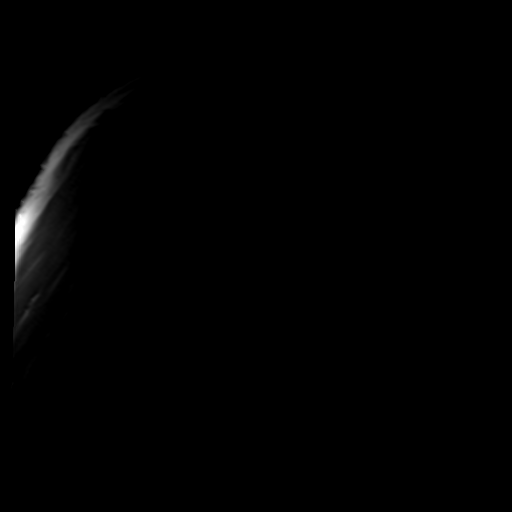
[im 4/19]
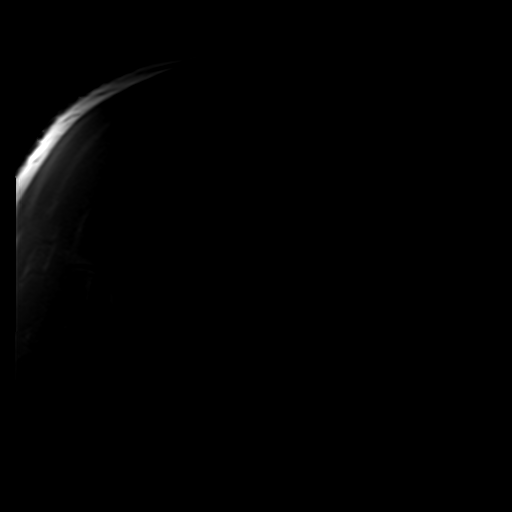
[im 7/19]
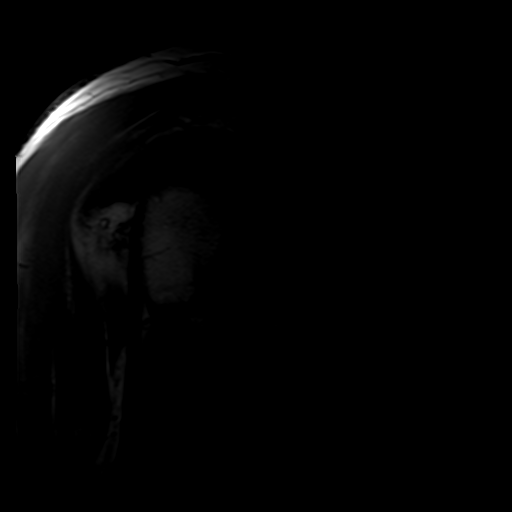
[im 10/19]
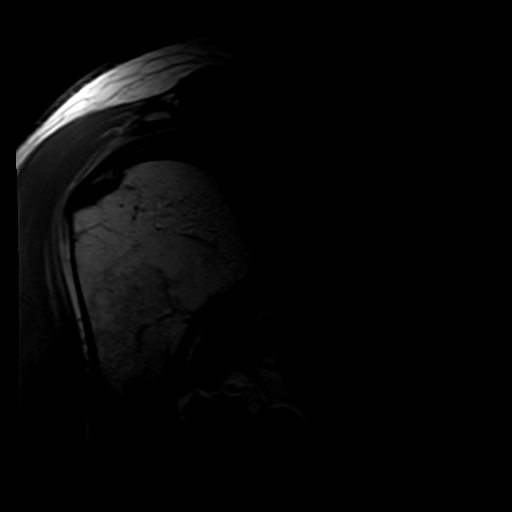
[im 13/19]
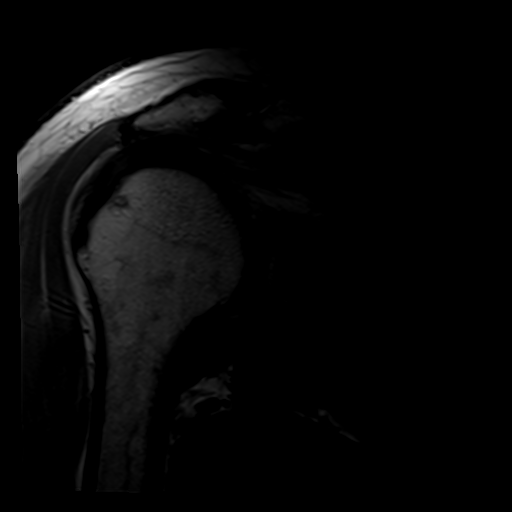
[im 16/19]
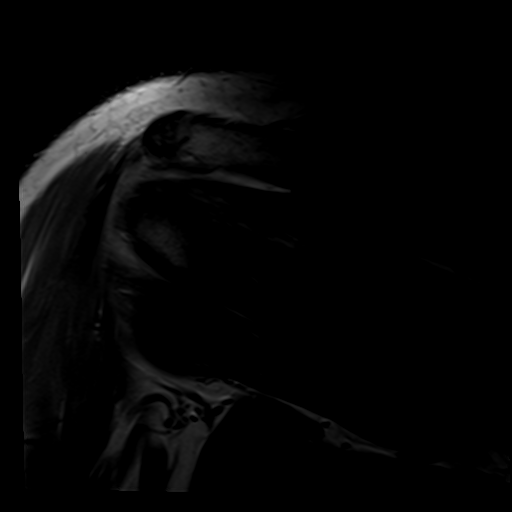
[im 19/19]
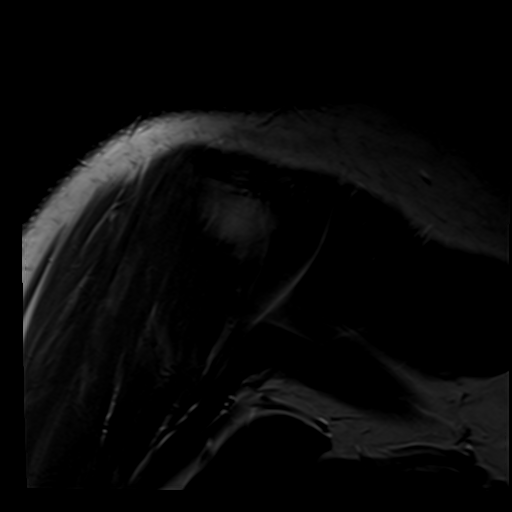

[35 of 40 positions shown; findings below may reference images not displayed]

FINDINGS: Rotator cuff: There is heterogeneously increased T2 signal in the
rotator cuff tendons. Undersurface tear of the anterior and far
lateral supraspinatus measures 0.2 cm. There are some small fissures
more posteriorly in the supraspinatus. No full-thickness tear or
tendon retraction.

Muscles:  Normal without atrophy or focal lesion.

Biceps long head:  Intact.

Acromioclavicular Joint: Moderate osteoarthritis. Type 2 acromion.
There is fluid in the subacromial/subdeltoid bursa. The patient has
a meso- acromion type os acromiale with some degenerative change at
the synchondrosis. Subacromial spurring is present. There is fluid
in the subacromial/subdeltoid bursa.

Glenohumeral Joint: Appears normal.

Labrum:  Intact.

Bones:  No fracture or focal lesion.

Other: None.
IMPRESSION: Rotator cuff tendinopathy with a tiny undersurface tear of the
anterior and far lateral supraspinatus.

Moderate acromioclavicular osteoarthritis. Os acromiale with
degenerative change about the synchondrosis also noted.

Subacromial spurring.

Subacromial/subdeltoid bursitis.

## 2020-08-17 ENCOUNTER — Telehealth: Payer: Self-pay | Admitting: Orthopaedic Surgery

## 2020-08-17 NOTE — Telephone Encounter (Signed)
Can you check this?

## 2020-08-17 NOTE — Telephone Encounter (Signed)
Patient called advised he called several weeks ago ( around The end of November) asking if he can get the gel injection for both knees and have not heard back yet.  Patient asked for a call back as soon as possible. The number to contact patient is [phone]

## 2020-08-20 NOTE — Telephone Encounter (Signed)
Talked with patient and advised him that next available bilateral knee injections would need to be after 09/07/2020 and once approved, he will get a call back to schedule.  Patient voiced that he understands.

## 2020-08-20 NOTE — Telephone Encounter (Signed)
April looks like you have this one. Dr. Erlinda Hong pt. Wont be due until 09/07/20

## 2020-08-21 ENCOUNTER — Ambulatory Visit (INDEPENDENT_AMBULATORY_CARE_PROVIDER_SITE_OTHER): Payer: No Typology Code available for payment source | Admitting: Orthopaedic Surgery

## 2020-08-21 ENCOUNTER — Other Ambulatory Visit: Payer: Self-pay | Admitting: Physician Assistant

## 2020-08-21 DIAGNOSIS — M19012 Primary osteoarthritis, left shoulder: Secondary | ICD-10-CM | POA: Insufficient documentation

## 2020-08-21 MED ORDER — TRAMADOL HCL 50 MG PO TABS: 50.0000 mg | ORAL_TABLET | Freq: Two times a day (BID) | ORAL | 1 refills | Status: DC | PRN

## 2020-08-21 NOTE — Progress Notes (Signed)
Office Visit Note   Patient: Tony Waller           Date of Birth: 1971/05/24           MRN: [id number] Visit Date: 08/21/2020              Requested by: Aura Dials, [place] [place],  [place] [address] PCP: Aura Dials, MD   Assessment & Plan: Visit Diagnoses:  1. Arthrosis of left acromioclavicular joint     Plan: MRI shows small undersurface tear of the supraspinatus but I don't think this is symptomatic.  He does have moderate OA of the [facility] joint which is what is symptomatic.  He would like to hold off on injection for now.  He will continue to take tramadol as needed.  He would likely come back to have the [facility] joint injected again when he comes back for Synvisc injections.  Follow-Up Instructions: No follow-ups on file.   Orders:  No orders of the defined types were placed in this encounter.  Meds ordered this encounter  Medications  . traMADol (ULTRAM) 50 MG tablet    Sig: Take 1 tablet (50 mg total) by mouth 2 (two) times daily as needed.    Dispense:  30 tablet    Refill:  1      Procedures: No procedures performed   Clinical Data: No additional findings.   Subjective: Chief Complaint  Patient presents with  . Left Shoulder - Follow-up    Nolin is here today for MRI review of the left shoulder.  Denies any changes.  He states that the prior [facility] joint injection helped significantly.  This was performed by Dr. Junius Roads under ultrasound.   Review of Systems  Constitutional: Negative.   All other systems reviewed and are negative.    Objective: Vital Signs: There were no vitals taken for this visit.  Physical Exam  Ortho Exam Left shoulder shows point tenderness to the [facility] joint and slightly to the acromion.  Rotator cuff intact to manual muscle testing.  Specialty Comments:  No specialty comments available.  Imaging: No results found.   PMFS History: Patient Active Problem List   Diagnosis Date Noted  . Arthrosis of left  acromioclavicular joint 08/21/2020  . Acute medial meniscus tear of left knee 04/13/2019  . Stress fracture of left tibia 04/13/2019  . OBSTRUCTIVE SLEEP APNEA 05/28/2009  . ALLERGIC RHINITIS 05/28/2009   Past Medical History:  Diagnosis Date  . ALLERGIC RHINITIS   . OSA (obstructive sleep apnea)     Family History  Problem Relation Age of Onset  . Allergies Mother   . Heart disease Paternal Grandfather   . Lung cancer Paternal Grandfather   . Brain cancer Maternal Uncle     Past Surgical History:  Procedure Laterality Date  . BUNIONECTOMY Left   . KNEE ARTHROSCOPY WITH SUBCHONDROPLASTY Left 06/22/2019   Procedure: LEFT KNEE ARTHROSCOPY WITH PARTIAL MEDIAL MENISCECTOMY, SUBCHONDROPLASTY AND EXCISION OF CYST;  Surgeon: Leandrew Koyanagi, MD;  Location: [place];  Service: Orthopedics;  Laterality: Left;  . NASAL SINUS SURGERY  2000   for deviated septum and turbonate reduction  . TONSILLECTOMY  1994   Social History   Occupational History  . Occupation: Scientist, product/process development: Theme park manager  Tobacco Use  . Smoking status: Former Smoker    Years: 9.00  . Smokeless tobacco: Never Used  Vaping Use  . Vaping Use: Never used  Substance and Sexual Activity  . Alcohol use: Yes    Comment: occas  . Drug use: Never  . Sexual activity: Not on file

## 2020-08-29 ENCOUNTER — Telehealth: Payer: Self-pay | Admitting: Family Medicine

## 2020-08-29 NOTE — Telephone Encounter (Signed)
No, but we could do one if he wants.

## 2020-08-29 NOTE — Telephone Encounter (Signed)
Did he? The patient did have an ac joint injection in September, which helped, but did not want another on 08/21/20 (at the ov with Dr. Erlinda Hong).

## 2020-08-29 NOTE — Telephone Encounter (Signed)
I called and left message that he can call or message [place] back if he would like to schedule the ac injection.

## 2020-08-29 NOTE — Telephone Encounter (Signed)
Patient called. Would like to know if Dr. Erlinda Hong talked to Dr. Junius Roads about doing an injection. His call back number is [phone]

## 2020-08-31 ENCOUNTER — Telehealth: Payer: Self-pay

## 2020-08-31 NOTE — Telephone Encounter (Signed)
Submitted VOB, SynviscOne, bilateral knee.

## 2020-09-05 ENCOUNTER — Encounter: Payer: Self-pay | Admitting: Orthopaedic Surgery

## 2020-09-05 ENCOUNTER — Telehealth: Payer: Self-pay

## 2020-09-05 ENCOUNTER — Ambulatory Visit (INDEPENDENT_AMBULATORY_CARE_PROVIDER_SITE_OTHER): Payer: No Typology Code available for payment source

## 2020-09-05 ENCOUNTER — Ambulatory Visit (INDEPENDENT_AMBULATORY_CARE_PROVIDER_SITE_OTHER): Payer: No Typology Code available for payment source | Admitting: Orthopaedic Surgery

## 2020-09-05 DIAGNOSIS — M1711 Unilateral primary osteoarthritis, right knee: Secondary | ICD-10-CM | POA: Diagnosis not present

## 2020-09-05 MED ORDER — LIDOCAINE HCL 1 % IJ SOLN
2.0000 mL | INTRAMUSCULAR | Status: AC | PRN
  Administered 2020-09-05: 2 mL

## 2020-09-05 MED ORDER — METHYLPREDNISOLONE ACETATE 40 MG/ML IJ SUSP
40.0000 mg | INTRAMUSCULAR | Status: AC | PRN
Start: 2020-09-05 — End: 2020-09-05
  Administered 2020-09-05: 40 mg via INTRA_ARTICULAR

## 2020-09-05 MED ORDER — BUPIVACAINE HCL 0.5 % IJ SOLN
2.0000 mL | INTRAMUSCULAR | Status: AC | PRN
  Administered 2020-09-05: 2 mL via INTRA_ARTICULAR

## 2020-09-05 NOTE — Telephone Encounter (Signed)
Patient has been approved and has an appointment for 09/11/2020.

## 2020-09-05 NOTE — Telephone Encounter (Signed)
Approved for SynviscOne, bilateral knee. Buy & Bill Covered at 100% through his insurance No Co-pay PA required PA Approval# [id number] Valid 02/25/2020-02/23/2021  Appt. 09/11/2020 with Dr. Erlinda Hong.

## 2020-09-05 NOTE — Progress Notes (Signed)
Office Visit Note   Patient: Tony Waller           Date of Birth: May 30, 1971           MRN: [id number] Visit Date: 09/05/2020              Requested by: Aura Dials, [place],   [id number] PCP: Aura Dials, MD   Assessment & Plan: Visit Diagnoses:  1. Primary osteoarthritis of right knee     Plan: Impression is right knee degenerative joint disease.  The patient would like to proceed with cortisone injection today.  He would also like to obtain approval for repeat viscosupplementation injection.  Follow-up with [place] once approved. This patient is diagnosed with osteoarthritis of the knee(s).    Radiographs show evidence of joint space narrowing, osteophytes, subchondral sclerosis and/or subchondral cysts.  This patient has knee pain which interferes with functional and activities of daily living.    This patient has experienced inadequate response, adverse effects and/or intolerance with conservative treatments such as acetaminophen, NSAIDS, topical creams, physical therapy or regular exercise, knee bracing and/or weight loss.   This patient has experienced inadequate response or has a contraindication to intra articular steroid injections for at least 3 months.   This patient is not scheduled to have a total knee replacement within 6 months of starting treatment with viscosupplementation.   Follow-Up Instructions: Return for once approved for visco inj.   Orders:  Orders Placed This Encounter  Procedures  . XR KNEE 3 VIEW RIGHT   No orders of the defined types were placed in this encounter.     Procedures: Large Joint Inj: R knee on 09/05/2020 7:40 PM Indications: pain Details: 22 G needle  Arthrogram: No  Medications: 40 mg methylPREDNISolone acetate 40 MG/ML; 2 mL lidocaine 1 %; 2 mL bupivacaine 0.5 % Consent was given by the patient. Patient was prepped and draped in the usual sterile fashion.       Clinical Data: No additional  findings.   Subjective: Chief Complaint  Patient presents with  . Right Knee - Pain    HPI patient is a pleasant 50 year old gentleman who comes in today with right knee pain.  He has an underlying history of osteoarthritis to both knees and has had both knees injected with cortisone and viscosupplementation injection.  Right knee pain returned about a week and a half ago after he started back at the gym.  The majority of his pain is to the lateral patella facet.  No mechanical symptoms.  He has increased pain going up and down steps.  He has tried tramadol and Voltaren gel without relief of symptoms.  Review of Systems as detailed in HPI.  All others reviewed and are negative.   Objective: Vital Signs: There were no vitals taken for this visit.  Physical Exam well-developed well-nourished gentleman in no acute distress.  Alert and oriented x3.  Ortho Exam right knee exam shows no effusion.  Range of motion from 0 to 120 degrees.  No joint line tenderness.  Moderate patellofemoral crepitus.  He does have tenderness along the lateral patella facet.  He is neurovascular intact distally.  Specialty Comments:  No specialty comments available.  Imaging: XR KNEE 3 VIEW RIGHT  Result Date: 09/05/2020 Advanced degenerative changes to the patellofemoral compartment with moderate degenerative changes medial compartment    PMFS History: Patient Active Problem List   Diagnosis Date Noted  . Arthrosis of left acromioclavicular  joint 08/21/2020  . Acute medial meniscus tear of left knee 04/13/2019  . Stress fracture of left tibia 04/13/2019  . OBSTRUCTIVE SLEEP APNEA 05/28/2009  . ALLERGIC RHINITIS 05/28/2009   Past Medical History:  Diagnosis Date  . ALLERGIC RHINITIS   . OSA (obstructive sleep apnea)     Family History  Problem Relation Age of Onset  . Allergies Mother   . Heart disease Paternal Grandfather   . Lung cancer Paternal Grandfather   . Brain cancer Maternal Uncle      Past Surgical History:  Procedure Laterality Date  . BUNIONECTOMY Left   . KNEE ARTHROSCOPY WITH SUBCHONDROPLASTY Left 06/22/2019   Procedure: LEFT KNEE ARTHROSCOPY WITH PARTIAL MEDIAL MENISCECTOMY, SUBCHONDROPLASTY AND EXCISION OF CYST;  Surgeon: Leandrew Koyanagi, MD;  Location: [place];  Service: Orthopedics;  Laterality: Left;  . NASAL SINUS SURGERY  2000   for deviated septum and turbonate reduction  . TONSILLECTOMY  1994   Social History   Occupational History  . Occupation: Scientist, product/process development: Theme park manager  Tobacco Use  . Smoking status: Former Smoker    Years: 9.00  . Smokeless tobacco: Never Used  Vaping Use  . Vaping Use: Never used  Substance and Sexual Activity  . Alcohol use: Yes    Comment: occas  . Drug use: Never  . Sexual activity: Not on file

## 2020-09-05 NOTE — Telephone Encounter (Signed)
Please submit for bil knee gel inj's - Dr. Erlinda Hong.

## 2020-09-10 ENCOUNTER — Ambulatory Visit: Payer: Self-pay

## 2020-09-10 ENCOUNTER — Ambulatory Visit (INDEPENDENT_AMBULATORY_CARE_PROVIDER_SITE_OTHER): Payer: No Typology Code available for payment source | Admitting: Family Medicine

## 2020-09-10 ENCOUNTER — Other Ambulatory Visit: Payer: Self-pay

## 2020-09-10 DIAGNOSIS — M19012 Primary osteoarthritis, left shoulder: Secondary | ICD-10-CM

## 2020-09-10 NOTE — Progress Notes (Signed)
Subjective: He is here for planned left shoulder AC joint injection.  Prior injection helped for a few weeks, then he thinks he overdid it at the gym.  Objective: Full range of motion of his shoulder.  He is tender over the [facility] joint.  Procedure: Ultrasound-guided left AC joint injection: After sterile prep with Betadine, injected 4 cc 0.25% bupivacaine then 6 mg betamethasone without complication.  He had good relief during the immediate anesthetic phase.  Follow-up as needed.

## 2020-09-11 ENCOUNTER — Encounter: Payer: Self-pay | Admitting: Orthopaedic Surgery

## 2020-09-11 ENCOUNTER — Ambulatory Visit (INDEPENDENT_AMBULATORY_CARE_PROVIDER_SITE_OTHER): Payer: No Typology Code available for payment source | Admitting: Orthopaedic Surgery

## 2020-09-11 DIAGNOSIS — M170 Bilateral primary osteoarthritis of knee: Secondary | ICD-10-CM | POA: Diagnosis not present

## 2020-09-11 MED ORDER — HYLAN G-F 20 48 MG/6ML IX SOSY
48.0000 mg | PREFILLED_SYRINGE | INTRA_ARTICULAR | Status: AC | PRN
  Administered 2020-09-11: 48 mg via INTRA_ARTICULAR

## 2020-09-11 MED ORDER — LIDOCAINE HCL 1 % IJ SOLN
3.0000 mL | INTRAMUSCULAR | Status: AC | PRN
  Administered 2020-09-11: 3 mL

## 2020-09-11 MED ORDER — BUPIVACAINE HCL 0.25 % IJ SOLN
0.6600 mL | INTRAMUSCULAR | Status: AC | PRN
  Administered 2020-09-11: .66 mL via INTRA_ARTICULAR

## 2020-09-11 NOTE — Progress Notes (Signed)
   Procedure Note  Patient: [place]             Date of Birth: 1971/01/14           MRN: [id number]             Visit Date: 09/11/2020  Procedures: Visit Diagnoses:  1. Bilateral primary osteoarthritis of knee     Large Joint Inj: bilateral knee on 09/11/2020 3:08 PM Indications: pain Details: 22 G needle, anterolateral approach Medications (Right): 0.66 mL bupivacaine 0.25 %; 3 mL lidocaine 1 %; 48 mg Hylan 48 MG/6ML Medications (Left): 0.66 mL bupivacaine 0.25 %; 3 mL lidocaine 1 %; 48 mg Hylan 48 MG/6ML

## 2020-10-24 ENCOUNTER — Other Ambulatory Visit: Payer: Self-pay

## 2020-10-24 ENCOUNTER — Encounter: Payer: Self-pay | Admitting: Physician Assistant

## 2020-10-24 ENCOUNTER — Ambulatory Visit (INDEPENDENT_AMBULATORY_CARE_PROVIDER_SITE_OTHER): Payer: No Typology Code available for payment source | Admitting: Orthopaedic Surgery

## 2020-10-24 ENCOUNTER — Ambulatory Visit (INDEPENDENT_AMBULATORY_CARE_PROVIDER_SITE_OTHER): Payer: No Typology Code available for payment source

## 2020-10-24 DIAGNOSIS — M7702 Medial epicondylitis, left elbow: Secondary | ICD-10-CM | POA: Diagnosis not present

## 2020-10-24 DIAGNOSIS — M25522 Pain in left elbow: Secondary | ICD-10-CM | POA: Diagnosis not present

## 2020-10-24 MED ORDER — TRAMADOL HCL 50 MG PO TABS: 50.0000 mg | ORAL_TABLET | Freq: Two times a day (BID) | ORAL | 0 refills | Status: DC | PRN

## 2020-10-24 NOTE — Progress Notes (Signed)
Office Visit Note   Patient: Tony Waller           Date of Birth: 03/26/1971           MRN: [id number] Visit Date: 10/24/2020              Requested by: Aura Dials, [place] [place],  [place] [address] PCP: Aura Dials, MD   Assessment & Plan: Visit Diagnoses:  1. Pain in left elbow   2. Medial epicondylitis of left elbow     Plan: Impression is left elbow medial epicondylitis.  We did discuss various treatment options to include cortisone injection and flexor tendon stretches.  He is interested in both.  He will also take a few weeks off from lifting weights.  Follow-up with [place] as needed.  Follow-Up Instructions: Return if symptoms worsen or fail to improve.   Orders:  Orders Placed This Encounter  Procedures   XR Elbow Complete Left (3+View)   Meds ordered this encounter  Medications   traMADol (ULTRAM) 50 MG tablet    Sig: Take 1 tablet (50 mg total) by mouth every 12 (twelve) hours as needed.    Dispense:  60 tablet    Refill:  0      Procedures: Hand/UE Inj: L elbow for medial epicondylitis on 10/25/2020 8:05 AM Indications: pain Details: 22 G needle Medications: 1 mL lidocaine 1 %; 0.33 mL bupivacaine 0.25 %; 40 mg methylPREDNISolone acetate 40 MG/ML      Clinical Data: No additional findings.   Subjective: Chief Complaint  Patient presents with   Left Elbow - Pain   Left Shoulder - Pain    HPI patient is a very pleasant 50 year old gentleman who comes in today with left elbow pain for the past 6 to 7 weeks.  No specific injury or change in activity but he does lift quite little weights on a weekly basis.  The pain is all located to the medial aspect.  Worse with bicep curls and shoulder press.  He has been taking tramadol without significant relief.  He notes occasional burning to the elbow.  Nothing down the arm or into the fingers.  He does note that he recently took a break from lifting weights for about a week and his symptoms did  somewhat improve.  Review of Systems as detailed in HPI.  All others are negative.   Objective: Vital Signs: There were no vitals taken for this visit.  Physical Exam well-developed well-nourished gentleman no acute distress peer alert and oriented x3.  Ortho Exam left elbow shows moderate tenderness along the medial epicondyle.  Full range of motion of the elbow and wrist without pain.  Full strength throughout.  He is neurovascular intact distally.  Specialty Comments:  No specialty comments available.  Imaging: XR Elbow Complete Left (3+View)  Result Date: 10/24/2020 No acute or structural abnormalities    PMFS History: Patient Active Problem List   Diagnosis Date Noted   Arthrosis of left acromioclavicular joint 08/21/2020   Acute medial meniscus tear of left knee 04/13/2019   Stress fracture of left tibia 04/13/2019   OBSTRUCTIVE SLEEP APNEA 05/28/2009   ALLERGIC RHINITIS 05/28/2009   Past Medical History:  Diagnosis Date   ALLERGIC RHINITIS    OSA (obstructive sleep apnea)     Family History  Problem Relation Age of Onset   Allergies Mother    Heart disease Paternal Grandfather    Lung cancer Paternal Grandfather  Brain cancer Maternal Uncle     Past Surgical History:  Procedure Laterality Date   BUNIONECTOMY Left    KNEE ARTHROSCOPY WITH SUBCHONDROPLASTY Left 06/22/2019   Procedure: LEFT KNEE ARTHROSCOPY WITH PARTIAL MEDIAL MENISCECTOMY, SUBCHONDROPLASTY AND EXCISION OF CYST;  Surgeon: Leandrew Koyanagi, MD;  Location: [place];  Service: Orthopedics;  Laterality: Left;   NASAL SINUS SURGERY  2000   for deviated septum and turbonate reduction   TONSILLECTOMY  1994   Social History   Occupational History   Occupation: Scientist, product/process development: Theme park manager  Tobacco Use   Smoking status: Former Smoker    Years: 9.00   Smokeless tobacco: Never Used  Scientific laboratory technician Use: Never used  Substance and Sexual  Activity   Alcohol use: Yes    Comment: occas   Drug use: Never   Sexual activity: Not on file

## 2020-10-25 DIAGNOSIS — M25522 Pain in left elbow: Secondary | ICD-10-CM

## 2020-10-25 MED ORDER — LIDOCAINE HCL 1 % IJ SOLN
1.0000 mL | INTRAMUSCULAR | Status: AC | PRN
Start: 2020-10-25 — End: 2020-10-25
  Administered 2020-10-25: 1 mL

## 2020-10-25 MED ORDER — BUPIVACAINE HCL 0.25 % IJ SOLN
0.3300 mL | INTRAMUSCULAR | Status: AC | PRN
  Administered 2020-10-25: .33 mL

## 2020-10-25 MED ORDER — METHYLPREDNISOLONE ACETATE 40 MG/ML IJ SUSP
40.0000 mg | INTRAMUSCULAR | Status: AC | PRN
Start: 2020-10-25 — End: 2020-10-25
  Administered 2020-10-25: 40 mg

## 2020-11-01 ENCOUNTER — Ambulatory Visit (INDEPENDENT_AMBULATORY_CARE_PROVIDER_SITE_OTHER): Payer: No Typology Code available for payment source | Admitting: Medical

## 2020-11-01 ENCOUNTER — Other Ambulatory Visit: Payer: Self-pay

## 2020-11-01 ENCOUNTER — Encounter: Payer: Self-pay | Admitting: Medical

## 2020-11-01 VITALS — BP 120/90 | HR 95 | Ht 74.0 in | Wt 286.2 lb

## 2020-11-01 DIAGNOSIS — Z6836 Body mass index (BMI) 36.0-36.9, adult: Secondary | ICD-10-CM | POA: Diagnosis not present

## 2020-11-01 DIAGNOSIS — G4733 Obstructive sleep apnea (adult) (pediatric): Secondary | ICD-10-CM | POA: Diagnosis not present

## 2020-11-01 DIAGNOSIS — M170 Bilateral primary osteoarthritis of knee: Secondary | ICD-10-CM | POA: Diagnosis not present

## 2020-11-01 DIAGNOSIS — Z1211 Encounter for screening for malignant neoplasm of colon: Secondary | ICD-10-CM | POA: Diagnosis not present

## 2020-11-01 NOTE — Progress Notes (Signed)
Subjective:  Tony Waller is a 50 y.o. male who presents for Chief Complaint  Patient presents with  . New Patient (Initial Visit)    Pt present to establish care but has no other concerns today      Here to establish care  Medical team: Dr. Frankey Shown and Dr. Junius Roads, ortho care  Was going to [facility] prior.  Trying to lose weight.  Having some knee pain, shoulder pain.   Was using phentermine some for exercise.  Exercises 5 days per week, weights 4 days per week, just recently increased cardio.  At least 40 minutes cardio.   No alcohol in the week, some on a weekend.  Current eating habits pretty good, but trying some intermittent fasting.  Eating 12pm - 8 pm.     No prior issues with blood pressure.  Last physical last year.    No other aggravating or relieving factors.    No other c/o.  Past Medical History:  Diagnosis Date  . ALLERGIC RHINITIS   . Arthralgia    knees, shoulder  . OSA (obstructive sleep apnea) 2011   on CPAP most nights  . Wears contact lenses      The following portions of the patient's history were reviewed and updated as appropriate: allergies, current medications, past family history, past medical history, past social history, past surgical history and problem list.  ROS Otherwise as in subjective above  Objective: BP 120/90   Pulse 95   Ht 6' 2"  (1.88 m)   Wt 286 lb 3.2 oz (129.8 kg)   SpO2 95%   BMI 36.75 kg/m   General appearance: alert, no distress, well developed, well nourished Neck: supple, no lymphadenopathy, no thyromegaly, no masses Heart: RRR, normal S1, S2, no murmurs Lungs: CTA bilaterally, no wheezes, rhonchi, or rales Pulses: 2+ radial pulses, 2+ pedal pulses, normal cap refill Ext: no edema   Assessment: Encounter Diagnoses  Name Primary?  . Osteoarthritis of both knees, unspecified osteoarthritis type Yes  . BMI 36.0-36.9,adult   . OSA (obstructive sleep apnea)   . Screen for colon cancer       Plan: Discussed concerns, efforts to lose weight.   He will call insurance about coverage for medications to help with weight loss.  C/t exercise, work on varying intensity of exercise, intervals, discuss healthy food choices.    He will insurance coverage for colon cancer screening   Tony Waller was seen today for new patient (initial visit).  Diagnoses and all orders for this visit:  Osteoarthritis of both knees, unspecified osteoarthritis type  BMI 36.0-36.9,adult  OSA (obstructive sleep apnea)  Screen for colon cancer    Follow up: soon for fasting physical

## 2020-11-01 NOTE — Patient Instructions (Addendum)
Please call insurance and inquire about medications for weight loss   Contrave  [facility]  Qsymia   Off label use of: Wellbutrin [employer] and inquire about coverage for colon cancer screening  cologard vs colonoscopy    Bring copy of vaccines next time

## 2020-11-07 ENCOUNTER — Other Ambulatory Visit: Payer: Self-pay

## 2020-11-07 ENCOUNTER — Encounter: Payer: Self-pay | Admitting: Podiatry

## 2020-11-07 ENCOUNTER — Ambulatory Visit (INDEPENDENT_AMBULATORY_CARE_PROVIDER_SITE_OTHER): Payer: No Typology Code available for payment source | Admitting: Podiatry

## 2020-11-07 DIAGNOSIS — M722 Plantar fascial fibromatosis: Secondary | ICD-10-CM | POA: Diagnosis not present

## 2020-11-07 DIAGNOSIS — M21621 Bunionette of right foot: Secondary | ICD-10-CM | POA: Diagnosis not present

## 2020-11-07 MED ORDER — TRIAMCINOLONE ACETONIDE 10 MG/ML IJ SUSP
10.0000 mg | Freq: Once | INTRAMUSCULAR | Status: AC
  Administered 2020-11-07: 10 mg

## 2020-11-07 NOTE — Progress Notes (Signed)
Subjective:   Patient ID: Tony Waller, male   DOB: 50 y.o.   MRN: [id number]   HPI Patient presents stating that he has discomfort plantar aspect of the left heel has developed lesions right and knows he needs to get his tailor's bunion right fixed and wanted to review recovery   ROS      Objective:  Physical Exam  Neurovascular status intact with exquisite discomfort plantar aspect left heel insertional point tendon calcaneus with patient noted to have prominence of the right fifth metatarsal that is been getting sore and lesion formation on the side of the foot     Assessment:  Acute plantar fasciitis left along with lesion formation and tailor's bunion deformity right     Plan:  H&P reviewed conditions and for the heel I did do sterile prep injected the plantar fascia 3 mg Kenalog 5 mg Xylocaine debrided the lesion discussed the tailor's bunion deformity and correction he wants to get this done but wants to wait till later in the year today we reviewed the recovery process

## 2020-11-12 ENCOUNTER — Telehealth: Payer: Self-pay

## 2020-11-12 ENCOUNTER — Other Ambulatory Visit: Payer: Self-pay | Admitting: Medical

## 2020-11-12 MED ORDER — NALTREXONE-BUPROPION HCL ER 8-90 MG PO TB12: 2.0000 | ORAL_TABLET | Freq: Two times a day (BID) | ORAL | 0 refills | Status: DC

## 2020-11-12 NOTE — Telephone Encounter (Signed)
Pt left message that Min[facility] is covered by insurance please sent in Rx

## 2020-11-12 NOTE — Telephone Encounter (Signed)
Contrave sent.  Begin as labeled.  This is in place of phentermine or other weight loss medicaiton.  Schedule 28mofasting follow up

## 2020-11-17 NOTE — Telephone Encounter (Signed)
P.A. CONTRAVE

## 2020-11-30 NOTE — Telephone Encounter (Signed)
P.A. approved til 03/15/21

## 2020-12-04 NOTE — Telephone Encounter (Signed)
Called pt and informed, he will call back and schedule appt

## 2020-12-11 ENCOUNTER — Other Ambulatory Visit: Payer: Self-pay | Admitting: Medical

## 2020-12-20 ENCOUNTER — Other Ambulatory Visit: Payer: Self-pay | Admitting: Medical

## 2020-12-21 ENCOUNTER — Other Ambulatory Visit: Payer: Self-pay | Admitting: Medical

## 2020-12-21 MED ORDER — ACYCLOVIR 400 MG PO TABS: 400.0000 mg | ORAL_TABLET | Freq: Two times a day (BID) | ORAL | 3 refills | Status: DC

## 2020-12-21 NOTE — Progress Notes (Signed)
acyclovi

## 2020-12-25 ENCOUNTER — Encounter: Payer: Self-pay | Admitting: Orthopaedic Surgery

## 2020-12-25 ENCOUNTER — Ambulatory Visit (INDEPENDENT_AMBULATORY_CARE_PROVIDER_SITE_OTHER): Payer: No Typology Code available for payment source | Admitting: Orthopaedic Surgery

## 2020-12-25 DIAGNOSIS — M1712 Unilateral primary osteoarthritis, left knee: Secondary | ICD-10-CM

## 2020-12-25 DIAGNOSIS — M1711 Unilateral primary osteoarthritis, right knee: Secondary | ICD-10-CM

## 2020-12-25 MED ORDER — LIDOCAINE HCL 1 % IJ SOLN
2.0000 mL | INTRAMUSCULAR | Status: AC | PRN
  Administered 2020-12-25: 2 mL

## 2020-12-25 MED ORDER — LIDOCAINE HCL 1 % IJ SOLN
2.0000 mL | INTRAMUSCULAR | Status: AC | PRN
Start: 2020-12-25 — End: 2020-12-25
  Administered 2020-12-25: 2 mL

## 2020-12-25 MED ORDER — BUPIVACAINE HCL 0.5 % IJ SOLN
2.0000 mL | INTRAMUSCULAR | Status: AC | PRN
  Administered 2020-12-25: 2 mL via INTRA_ARTICULAR

## 2020-12-25 MED ORDER — METHYLPREDNISOLONE ACETATE 40 MG/ML IJ SUSP
40.0000 mg | INTRAMUSCULAR | Status: AC | PRN
  Administered 2020-12-25: 40 mg via INTRA_ARTICULAR

## 2020-12-25 NOTE — Progress Notes (Signed)
Office Visit Note   Patient: Tony Waller           Date of Birth: [birth year]           MRN: [id number] Visit Date: 12/25/2020              Requested by: Carlena Hurl, PA-C [address] [place],  [place] [id number] PCP: Carlena Hurl, PA-C   Assessment & Plan: Visit Diagnoses:  1. Primary osteoarthritis of right knee   2. Primary osteoarthritis of left knee     Plan: He continues to receive relief from cortisone injections.  Bilateral knee cortisone injections performed today.  He tolerated these well.  Follow-up as needed.  Follow-Up Instructions: Return if symptoms worsen or fail to improve.   Orders:  No orders of the defined types were placed in this encounter.  No orders of the defined types were placed in this encounter.     Procedures: Large Joint Inj: bilateral knee on 12/25/2020 2:13 PM Indications: pain Details: 22 G needle  Arthrogram: No  Medications (Right): 2 mL lidocaine 1 %; 2 mL bupivacaine 0.5 %; 40 mg methylPREDNISolone acetate 40 MG/ML Medications (Left): 2 mL lidocaine 1 %; 2 mL bupivacaine 0.5 %; 40 mg methylPREDNISolone acetate 40 MG/ML Outcome: tolerated well, no immediate complications Patient was prepped and draped in the usual sterile fashion.       Clinical Data: No additional findings.   Subjective: Chief Complaint  Patient presents with  . Left Knee - Pain  . Right Knee - Pain    Right knee follows up today for bilateral knee DJD.  Previous cortisone injection was 09/05/2020.  Previous gel injection was 09/11/2020.  He continues to get relief from the cortisone injections and is requesting repeating them today.   Review of Systems   Objective: Vital Signs: There were no vitals taken for this visit.  Physical Exam  Ortho Exam Bilateral knee exams are unchanged. Specialty Comments:  No specialty comments available.  Imaging: No results found.   PMFS History: Patient Active Problem List   Diagnosis Date  Noted  . BMI 36.0-36.9,adult 11/01/2020  . Osteoarthritis of both knees 11/01/2020  . OSA (obstructive sleep apnea) 11/01/2020  . Screen for colon cancer 11/01/2020  . Arthrosis of left acromioclavicular joint 08/21/2020  . Acute medial meniscus tear of left knee 04/13/2019  . Stress fracture of left tibia 04/13/2019  . OBSTRUCTIVE SLEEP APNEA 05/28/2009  . ALLERGIC RHINITIS 05/28/2009   Past Medical History:  Diagnosis Date  . ALLERGIC RHINITIS   . Arthralgia    knees, shoulder  . OSA (obstructive sleep apnea) 2011   on CPAP most nights  . Wears contact lenses     Family History  Problem Relation Age of Onset  . Allergies Mother   . Diabetes Mother   . Arthritis Mother   . Lung cancer Paternal Grandfather   . Cancer Paternal Grandfather   . Brain cancer Maternal Uncle   . Hyperlipidemia Father   . Hypertension Father   . Arthritis Brother   . Stroke Maternal Grandmother   . Heart disease Maternal Grandfather   . Heart disease Paternal Grandmother     Past Surgical History:  Procedure Laterality Date  . BUNIONECTOMY Left   . KNEE ARTHROSCOPY WITH SUBCHONDROPLASTY Left 06/22/2019   Procedure: LEFT KNEE ARTHROSCOPY WITH PARTIAL MEDIAL MENISCECTOMY, SUBCHONDROPLASTY AND EXCISION OF CYST;  Surgeon: Leandrew Koyanagi, MD;  Location: [facility];  Service: Orthopedics;  Laterality:  Left;  . NASAL SINUS SURGERY  2000   for deviated septum and turbonate reduction  . ROTATOR CUFF REPAIR     right  . TONSILLECTOMY  1994   Social History   Occupational History  . Occupation: Scientist, product/process development: [employer]  Tobacco Use  . Smoking status: Former Smoker    Years: 9.00    Types: Cigars  . Smokeless tobacco: Never Used  Vaping Use  . Vaping Use: Never used  Substance and Sexual Activity  . Alcohol use: Yes    Comment: occas  . Drug use: Never  . Sexual activity: Not on file

## 2021-01-02 ENCOUNTER — Other Ambulatory Visit: Payer: Self-pay

## 2021-01-02 DIAGNOSIS — Z1211 Encounter for screening for malignant neoplasm of colon: Secondary | ICD-10-CM

## 2021-01-03 ENCOUNTER — Other Ambulatory Visit: Payer: Self-pay

## 2021-01-03 ENCOUNTER — Ambulatory Visit (INDEPENDENT_AMBULATORY_CARE_PROVIDER_SITE_OTHER): Payer: No Typology Code available for payment source | Admitting: Family Medicine

## 2021-01-03 DIAGNOSIS — M25522 Pain in left elbow: Secondary | ICD-10-CM | POA: Diagnosis not present

## 2021-01-03 DIAGNOSIS — M25512 Pain in left shoulder: Secondary | ICD-10-CM | POA: Diagnosis not present

## 2021-01-03 DIAGNOSIS — M1712 Unilateral primary osteoarthritis, left knee: Secondary | ICD-10-CM

## 2021-01-03 DIAGNOSIS — M1711 Unilateral primary osteoarthritis, right knee: Secondary | ICD-10-CM

## 2021-01-03 MED ORDER — DICLOFENAC SODIUM 75 MG PO TBEC: 75.0000 mg | DELAYED_RELEASE_TABLET | Freq: Two times a day (BID) | ORAL | 3 refills | Status: DC | PRN

## 2021-01-03 NOTE — Patient Instructions (Addendum)
    Glucosamine Sulfate:  1,000 mg twice daily  Turmeric:  500 mg twice daily   Diclofenac pills twice daily as needed for flare-ups. Could consider Voltaren Gel topically (available over the counter or as prescription)    Diet:  Minimize sugar intake (it has inflammatory effects on joints).

## 2021-01-03 NOTE — Progress Notes (Signed)
Office Visit Note   Patient: Tony Waller           Date of Birth: [birth year]           MRN: [id number] Visit Date: 01/03/2021 Requested by: Carlena Hurl, PA-C [address] [place],  [place] [address] PCP: Carlena Hurl, PA-C  Subjective: Chief Complaint  Patient presents with  . Left Shoulder - Pain    The ac joint injection in January helped. He has started feeling pain in the anterior shoulder 2&1/2 weeks ago, rearranging furniture in his son's room.   . Left Elbow - Pain    Injection by Mendel Ryder in March helped. Pain returned with the shoulder pain.     HPI: He is here with recurrent left shoulder and elbow pain.  Symptoms started after rearranging some furniture a couple weeks ago.  He has had injections in both of these areas already in 2022, as well as recent knee injections.                ROS:   All other systems were reviewed and are negative.  Objective: Vital Signs: There were no vitals taken for this visit.  Physical Exam:  General:  Alert and oriented, in no acute distress. Pulm:  Breathing unlabored. Psy:  Normal mood, congruent affect.  Left shoulder: He has tenderness at the [facility] joint as well as positive AC crossover test, but most of his tenderness is along the long head biceps tendon.  Speeds test is positive.  No pain with forearm supination against resistance.   Elbow is tender at the medial epicondyle.  Imaging: No results found.  Assessment & Plan: 1.  Recurrent left shoulder and elbow pain, with history of multiple joint pains. -We will draw some labs today.  Diclofenac as needed, glucosamine and turmeric over-the-counter.  Start doing shoulder and elbow strengthening exercises with Thera-Band.  Could consider physical therapy or possibly repeat injections if symptoms do not improve, but would like to try to avoid cortisone if possible.     Procedures: No procedures performed        PMFS History: Patient Active Problem List    Diagnosis Date Noted  . BMI 36.0-36.9,adult 11/01/2020  . Osteoarthritis of both knees 11/01/2020  . OSA (obstructive sleep apnea) 11/01/2020  . Screen for colon cancer 11/01/2020  . Arthrosis of left acromioclavicular joint 08/21/2020  . Acute medial meniscus tear of left knee 04/13/2019  . Stress fracture of left tibia 04/13/2019  . OBSTRUCTIVE SLEEP APNEA 05/28/2009  . ALLERGIC RHINITIS 05/28/2009   Past Medical History:  Diagnosis Date  . ALLERGIC RHINITIS   . Arthralgia    knees, shoulder  . OSA (obstructive sleep apnea) 2011   on CPAP most nights  . Wears contact lenses     Family History  Problem Relation Age of Onset  . Allergies Mother   . Diabetes Mother   . Arthritis Mother   . Lung cancer Paternal Grandfather   . Cancer Paternal Grandfather   . Brain cancer Maternal Uncle   . Hyperlipidemia Father   . Hypertension Father   . Arthritis Brother   . Stroke Maternal Grandmother   . Heart disease Maternal Grandfather   . Heart disease Paternal Grandmother     Past Surgical History:  Procedure Laterality Date  . BUNIONECTOMY Left   . KNEE ARTHROSCOPY WITH SUBCHONDROPLASTY Left 06/22/2019   Procedure: LEFT KNEE ARTHROSCOPY WITH PARTIAL MEDIAL MENISCECTOMY, SUBCHONDROPLASTY AND EXCISION OF CYST;  Surgeon: Erlinda Hong,  Marylynn Pearson, MD;  Location: [facility];  Service: Orthopedics;  Laterality: Left;  . NASAL SINUS SURGERY  2000   for deviated septum and turbonate reduction  . ROTATOR CUFF REPAIR     right  . TONSILLECTOMY  1994   Social History   Occupational History  . Occupation: Scientist, product/process development: [employer]  Tobacco Use  . Smoking status: Former Smoker    Years: 9.00    Types: Cigars  . Smokeless tobacco: Never Used  Vaping Use  . Vaping Use: Never used  Substance and Sexual Activity  . Alcohol use: Yes    Comment: occas  . Drug use: Never  . Sexual activity: Not on file

## 2021-01-04 LAB — VITAMIN D 25 HYDROXY (VIT D DEFICIENCY, FRACTURES): Vit D, 25-Hydroxy: 24 ng/mL — ABNORMAL LOW (ref 30–100)

## 2021-01-04 LAB — RHEUMATOID FACTOR: Rheumatoid fact SerPl-aCnc: <14 IU/mL (ref <14)

## 2021-01-04 LAB — URIC ACID: Uric Acid, Serum: 6.6 mg/dL (ref 4.0–8.0)

## 2021-01-04 LAB — ANA: Anti Nuclear Antibody (ANA): NEGATIVE

## 2021-01-04 LAB — C-REACTIVE PROTEIN: CRP: 24 mg/L — ABNORMAL HIGH (ref <8.0)

## 2021-01-04 LAB — CYCLIC CITRUL PEPTIDE ANTIBODY, IGG: Cyclic Citrullin Peptide Ab: <16 UNITS

## 2021-01-04 LAB — SEDIMENTATION RATE: Sed Rate: 36 mm/h — ABNORMAL HIGH (ref 0–15)

## 2021-01-08 ENCOUNTER — Telehealth: Payer: Self-pay | Admitting: Family Medicine

## 2021-01-08 NOTE — Telephone Encounter (Signed)
Labs are notable for the following:  Vitamin D is low at 24.  We want this to be 50-80.  Low vitamin D can be a source of musculoskeletal pain.  I recommend taking vitamin D3 at 5000 IU daily.  Recheck in 4 to 6 months.  CRP, inflammation marker, is elevated at 24.  Reason is uncertain, this is a nonspecific test.  Sed rate is also an inflammation marker and is mildly elevated.  All other labs look good.  In general it is beneficial to exercise regularly and to minimize dietary intake of processed carbohydrates including breads, pastas, cereals, sugars and sweets.  Sometimes this will help normalize D inflammation markers.  Would recommend repeating CRP and ESR in 2-3 months to be sure they are normalizing.

## 2021-01-12 ENCOUNTER — Other Ambulatory Visit: Payer: Self-pay | Admitting: Medical

## 2021-01-14 ENCOUNTER — Ambulatory Visit: Payer: No Typology Code available for payment source | Admitting: Podiatry

## 2021-01-16 NOTE — Telephone Encounter (Signed)
Schedule weight loss f/u on medication

## 2021-01-21 ENCOUNTER — Other Ambulatory Visit: Payer: Self-pay | Admitting: Medical

## 2021-01-21 MED ORDER — PHENTERMINE HCL 37.5 MG PO TABS: 37.5000 mg | ORAL_TABLET | Freq: Every day | ORAL | 0 refills | Status: DC

## 2021-01-24 LAB — COLOGUARD: Cologuard: NEGATIVE

## 2021-01-30 ENCOUNTER — Telehealth: Payer: Self-pay | Admitting: Medical

## 2021-01-30 ENCOUNTER — Encounter: Payer: Self-pay | Admitting: Medical

## 2021-01-30 NOTE — Telephone Encounter (Signed)
Patient has been advised via mychart

## 2021-01-30 NOTE — Telephone Encounter (Signed)
Please let them know that the Cologuard screening for colon cancer was negative.  This indicates a lower likelihood that colorectal cancer is present.   Lets plan to repeat this in 3 years.  However, if the develop bowel changes, blood in stool, unexpected weight loss, or other new bowel changes, then recheck.

## 2021-02-15 ENCOUNTER — Encounter: Payer: Self-pay | Admitting: Medical

## 2021-02-15 ENCOUNTER — Ambulatory Visit (INDEPENDENT_AMBULATORY_CARE_PROVIDER_SITE_OTHER): Payer: No Typology Code available for payment source | Admitting: Medical

## 2021-02-15 ENCOUNTER — Other Ambulatory Visit: Payer: Self-pay

## 2021-02-15 VITALS — BP 140/82 | HR 89 | Ht 74.0 in | Wt 285.2 lb

## 2021-02-15 DIAGNOSIS — Z125 Encounter for screening for malignant neoplasm of prostate: Secondary | ICD-10-CM | POA: Diagnosis not present

## 2021-02-15 DIAGNOSIS — Z1329 Encounter for screening for other suspected endocrine disorder: Secondary | ICD-10-CM | POA: Diagnosis not present

## 2021-02-15 DIAGNOSIS — M170 Bilateral primary osteoarthritis of knee: Secondary | ICD-10-CM

## 2021-02-15 DIAGNOSIS — G4733 Obstructive sleep apnea (adult) (pediatric): Secondary | ICD-10-CM | POA: Diagnosis not present

## 2021-02-15 DIAGNOSIS — Z136 Encounter for screening for cardiovascular disorders: Secondary | ICD-10-CM

## 2021-02-15 DIAGNOSIS — Z131 Encounter for screening for diabetes mellitus: Secondary | ICD-10-CM

## 2021-02-15 DIAGNOSIS — Z0000 Encounter for general adult medical examination without abnormal findings: Secondary | ICD-10-CM

## 2021-02-15 DIAGNOSIS — E569 Vitamin deficiency, unspecified: Secondary | ICD-10-CM

## 2021-02-15 DIAGNOSIS — Z6836 Body mass index (BMI) 36.0-36.9, adult: Secondary | ICD-10-CM | POA: Diagnosis not present

## 2021-02-15 DIAGNOSIS — Z79899 Other long term (current) drug therapy: Secondary | ICD-10-CM

## 2021-02-15 DIAGNOSIS — Z7185 Encounter for immunization safety counseling: Secondary | ICD-10-CM | POA: Insufficient documentation

## 2021-02-15 DIAGNOSIS — R030 Elevated blood-pressure reading, without diagnosis of hypertension: Secondary | ICD-10-CM

## 2021-02-15 DIAGNOSIS — N400 Enlarged prostate without lower urinary tract symptoms: Secondary | ICD-10-CM

## 2021-02-15 LAB — POCT URINALYSIS DIP (PROADVANTAGE DEVICE)
Bilirubin, UA: NEGATIVE
Blood, UA: NEGATIVE
Glucose, UA: NEGATIVE mg/dL
Leukocytes, UA: NEGATIVE
Nitrite, UA: NEGATIVE
Specific Gravity, Urine: 1.025
Urobilinogen, Ur: NEGATIVE
pH, UA: 6 (ref 5.0–8.0)

## 2021-02-15 MED ORDER — VITAMIN D (ERGOCALCIFEROL) 1.25 MG (50000 UNIT) PO CAPS: 50000.0000 [IU] | ORAL_CAPSULE | ORAL | 3 refills | Status: DC

## 2021-02-15 NOTE — Progress Notes (Addendum)
Subjective:   HPI  Tony Waller is a 50 y.o. male who presents for Chief Complaint  Patient presents with   fasting cpe     Fasting cpe, no concerns    Patient Care Team: Krew Hortman, Leward Quan as PCP - General (Family Medicine) Sees dentist Sees eye doctor Dr. Eunice Blase and Dr. Frankey Shown, orthopedics  Concerns: We recently started phentermine to help  weight loss efforts.  He is working on lifestyle changes.  He does need to make some dietary changes.  He is getting some exercise.  He has been on phentermine before without complaint.  No current side effects.  Exercising 3 times a week with a trainer and 2 days a week on his own at [employer].  He did not tolerate Contrave in the past  He sees orthopedics for arthritis  Vitamin D deficiency-using over-the-counter vitamin D but only doing it 3 times per week on average.  He has a history of sleep apnea is not using the CPAP every single day  Reviewed their medical, surgical, family, social, medication, and allergy history and updated chart as appropriate.  Past Medical History:  Diagnosis Date   ALLERGIC RHINITIS    Arthralgia    knees, shoulder   OSA (obstructive sleep apnea) 2011   on CPAP most nights   Wears contact lenses     Past Surgical History:  Procedure Laterality Date   BUNIONECTOMY Left    KNEE ARTHROSCOPY WITH SUBCHONDROPLASTY Left 06/22/2019   Procedure: LEFT KNEE ARTHROSCOPY WITH PARTIAL MEDIAL MENISCECTOMY, SUBCHONDROPLASTY AND EXCISION OF CYST;  Surgeon: Leandrew Koyanagi, MD;  Location: [facility] [place];  Service: Orthopedics;  Laterality: Left;   NASAL SINUS SURGERY  2000   for deviated septum and turbonate reduction   ROTATOR CUFF REPAIR     right   TONSILLECTOMY  1994    Family History  Problem Relation Age of Onset   Allergies Mother    Diabetes Mother    Arthritis Mother    Lung cancer Paternal Grandfather    Cancer Paternal Grandfather    Brain cancer Maternal Uncle     Hyperlipidemia Father    Hypertension Father    Arthritis Brother    Stroke Maternal Grandmother    Heart disease Maternal Grandfather    Heart disease Paternal Grandmother      Current Outpatient Medications:    acyclovir (ZOVIRAX) 400 MG tablet, Take 1 tablet (400 mg total) by mouth 2 (two) times daily. For prevention.   Can use 2 tablets TID x 2 days for flare up, Disp: 84 tablet, Rfl: 3   diclofenac (VOLTAREN) 75 MG EC tablet, Take 1 tablet (75 mg total) by mouth 2 (two) times daily as needed., Disp: 60 tablet, Rfl: 3   phentermine (ADIPEX-P) 37.5 MG tablet, Take 1 tablet (37.5 mg total) by mouth daily before breakfast., Disp: 30 tablet, Rfl: 0   traMADol (ULTRAM) 50 MG tablet, Take 1 tablet (50 mg total) by mouth every 12 (twelve) hours as needed., Disp: 60 tablet, Rfl: 0   Vitamin D, Ergocalciferol, (DRISDOL) 1.25 MG (50000 UNIT) CAPS capsule, Take 1 capsule (50,000 Units total) by mouth every 7 (seven) days., Disp: 12 capsule, Rfl: 3  Allergies  Allergen Reactions   Penicillins Hives     Review of Systems Constitutional: -fever, -chills, -sweats, -unexpected weight change, -decreased appetite, -fatigue Allergy: -sneezing, -itching, -congestion Dermatology: -changing moles, --rash, -lumps ENT: -runny nose, -ear pain, -sore throat, -hoarseness, -sinus pain, -teeth pain, -  ringing in ears, -hearing loss, -nosebleeds Cardiology: -chest pain, -palpitations, -swelling, -difficulty breathing when lying flat, -waking up short of breath Respiratory: -cough, -shortness of breath, -difficulty breathing with exercise or exertion, -wheezing, -coughing up blood Gastroenterology: -abdominal pain, -nausea, -vomiting, -diarrhea, -constipation, -blood in stool, -changes in bowel movement, -difficulty swallowing or eating Hematology: -bleeding, -bruising  Musculoskeletal: +joint aches, -muscle aches, -joint swelling, -back pain, -neck pain, -cramping, -changes in gait Ophthalmology: denies vision  changes, eye redness, itching, discharge Urology: -burning with urination, -difficulty urinating, -blood in urine, -urinary frequency, -urgency, -incontinence Neurology: -headache, -weakness, -tingling, -numbness, -memory loss, -falls, -dizziness Psychology: -depressed mood, -agitation, -sleep problems Male GU: no testicular mass, pain, no lymph nodes swollen, no swelling, no rash.  Depression screen [facility] 2/9 02/15/2021 11/01/2020  Decreased Interest 0 0  Down, Depressed, Hopeless 0 0  PHQ - 2 Score 0 0        Objective:  BP 140/82   Pulse 89   Ht 6' 2"  (1.88 m)   Wt 285 lb 3.2 oz (129.4 kg)   BMI 36.62 kg/m   General appearance: alert, no distress, WD/WN, African American male Skin: unremarkable HEENT: normocephalic, conjunctiva/corneas normal, sclerae anicteric, PERRLA, EOMi, nares patent, no discharge or erythema, pharynx normal Oral cavity: MMM, tongue normal, teeth normal Neck: supple, no lymphadenopathy, no thyromegaly, no masses, normal ROM, no bruits Chest: non tender, normal shape and expansion Heart: RRR, normal S1, S2, no murmurs Lungs: CTA bilaterally, no wheezes, rhonchi, or rales Abdomen: +bs, soft, non tender, non distended, no masses, no hepatomegaly, no splenomegaly, no bruits Back: non tender, normal ROM, no scoliosis Musculoskeletal: upper extremities non tender, no obvious deformity, normal ROM throughout, lower extremities non tender, no obvious deformity, normal ROM throughout Extremities: no edema, no cyanosis, no clubbing Pulses: 2+ symmetric, upper and lower extremities, normal cap refill Neurological: alert, oriented x 3, CN2-12 intact, strength normal upper extremities and lower extremities, sensation normal throughout, DTRs 2+ throughout, no cerebellar signs, gait normal Psychiatric: normal affect, behavior normal, pleasant  GU: normal male external genitalia,circumcised, nontender, no masses, no hernia, no lymphadenopathy Rectal: anus normal tone,  prostate mild to moderate enlargemnet, no nodules    Adult ECG Report  Indication: screen for heart disease, high risk medication  Rate: 87 bpm  Rhythm: normal sinus rhythm  QRS Axis: -21 degrees  PR Interval: 136 ms  QRS Duration: 23m  QTc: 4541m Conduction Disturbances: none  Other Abnormalities: none  Patient's cardiac risk factors are: obese  EKG comparison: none  Narrative Interpretation: nsr    Assessment and Plan :   Encounter Diagnoses  Name Primary?   Encounter for health maintenance examination in adult Yes   Vitamin deficiency    Osteoarthritis of both knees, unspecified osteoarthritis type    OSA (obstructive sleep apnea)    BMI 36.0-36.9,adult    High risk medication use    Elevated blood-pressure reading without diagnosis of hypertension    Screening for prostate cancer    Screening for thyroid disorder    Screening for diabetes mellitus    Screening for heart disease    Prostate enlargement    Vaccine counseling     This visit was a preventative care visit, also known as wellness visit or routine physical.   Topics typically include healthy lifestyle, diet, exercise, preventative care, vaccinations, sick and well care, proper use of emergency dept and after hours care, as well as other concerns.     Recommendations: Continue to return yearly for your  annual wellness and preventative care visits.  This gives [place] a chance to discuss healthy lifestyle, exercise, vaccinations, review your chart record, and perform screenings where appropriate.  I recommend you see your eye doctor yearly for routine vision care.  I recommend you see your dentist yearly for routine dental care including hygiene visits twice yearly.   Vaccination recommendations were reviewed Immunization History  Administered Date(s) Administered   Influenza Whole 05/11/2009   Influenza,inj,Quad PF,6+ Mos 06/02/2018, 05/03/2019   I recommend a yearly flu shot in the fall  I recommend  tetanus booster every 10 years  He notes prior covid vaccines.   Screening for cancer: Colon cancer screening: I reviewed recent negative Cologard.  Repeat 3 years.  Testicular cancer screening You should do a monthly self testicular exam if you are between 81-67 years old  We discussed PSA, prostate exam, and prostate cancer screening risks/benefits.     Skin cancer screening: Check your skin regularly for new changes, growing lesions, or other lesions of concern Come in for evaluation if you have skin lesions of concern.  Lung cancer screening: If you have a greater than 20 pack year history of tobacco use, then you may qualify for lung cancer screening with a chest CT scan.   Please call your insurance company to inquire about coverage for this test.  We currently don't have screenings for other cancers besides breast, cervical, colon, and lung cancers.  If you have a strong family history of cancer or have other cancer screening concerns, please let me know.    Bone health: Get at least 150 minutes of aerobic exercise weekly Get weight bearing exercise at least once weekly Bone density test:  A bone density test is an imaging test that uses a type of X-ray to measure the amount of calcium and other minerals in your bones. The test may be used to diagnose or screen you for a condition that causes weak or thin bones (osteoporosis), predict your risk for a broken bone (fracture), or determine how well your osteoporosis treatment is working. The bone density test is recommended for females 53 and older, or females or males <51 if certain risk factors such as thyroid disease, long term use of steroids such as for asthma or rheumatological issues, vitamin D deficiency, estrogen deficiency, family history of osteoporosis, self or family history of fragility fracture in first degree relative.    Heart health: Get at least 150 minutes of aerobic exercise weekly Limit alcohol It is  important to maintain a healthy blood pressure and healthy cholesterol numbers  Heart disease screening: Screening for heart disease includes screening for blood pressure, fasting lipids, glucose/diabetes screening, BMI height to weight ratio, reviewed of smoking status, physical activity, and diet.    Goals include blood pressure 120/80 or less, maintaining a healthy lipid/cholesterol profile, preventing diabetes or keeping diabetes numbers under good control, not smoking or using tobacco products, exercising most days per week or at least 150 minutes per week of exercise, and eating healthy variety of fruits and vegetables, healthy oils, and avoiding unhealthy food choices like fried food, fast food, high sugar and high cholesterol foods.    Other tests may possibly include EKG test, CT coronary calcium score, echocardiogram, exercise treadmill stress test.     Medical care options: I recommend you continue to seek care here first for routine care.  We try really hard to have available appointments Monday through Friday daytime hours for sick visits, acute visits, and physicals.  Urgent care should be used for after hours and weekends for significant issues that cannot wait till the next day.  The emergency department should be used for significant potentially life-threatening emergencies.  The emergency department is expensive, can often have long wait times for less significant concerns, so try to utilize primary care, urgent care, or telemedicine when possible to avoid unnecessary trips to the emergency department.  Virtual visits and telemedicine have been introduced since the pandemic started in 2020, and can be convenient ways to receive medical care.  We offer virtual appointments as well to assist you in a variety of options to seek medical care.    Separate significant issues discussed: Obesity Exercise most days per week, continue with the trainer Work on dietary changes including  limiting or avoiding junk food, high carb foods, large portions, heavy meat portions Set short-term goals including fitness goals Consider other medication options such as Qsymia, Saxenda or other.  We can use phentermine short-term but this is not a good long-term solution   Vitamin D deficiency Begin vitamin D 50,000 units weekly Foods that contain vitamin D include seafood such as oysters, shrimp, salmon, herring, cod, egg yolks, mushrooms, milk, and foods fortified with Vitamin D such as orange juice, cereals.  Its also important to get some sun exposure regularly to absorb vitamin D.    Arthritis Seeing orthopedics for this   Elevated blood pressures Start checking blood pressures at home and write them down.  Check 2 or 3 days/week.  The goal is 120/70   Enlarged prostate on exam today Limit some of your fluid intake close to bedtime to prevent getting up at night to urinate   [place] was seen today for fasting cpe .  Diagnoses and all orders for this visit:  Encounter for health maintenance examination in adult -     Comprehensive metabolic panel -     CBC -     Lipid panel -     Hemoglobin A1c -     TSH -     EKG 12-Lead -     PSA -     POCT Urinalysis DIP (Proadvantage Device)  Vitamin deficiency  Osteoarthritis of both knees, unspecified osteoarthritis type  OSA (obstructive sleep apnea)  BMI 36.0-36.9,adult  High risk medication use -     EKG 12-Lead  Elevated blood-pressure reading without diagnosis of hypertension  Screening for prostate cancer -     PSA  Screening for thyroid disorder -     TSH  Screening for diabetes mellitus -     Hemoglobin A1c  Screening for heart disease -     EKG 12-Lead  Prostate enlargement  Vaccine counseling  Other orders -     Vitamin D, Ergocalciferol, (DRISDOL) 1.25 MG (50000 UNIT) CAPS capsule; Take 1 capsule (50,000 Units total) by mouth every 7 (seven) days.   Follow-up pending labs, yearly for  physical

## 2021-02-15 NOTE — Patient Instructions (Signed)
This visit was a preventative care visit, also known as wellness visit or routine physical.   Topics typically include healthy lifestyle, diet, exercise, preventative care, vaccinations, sick and well care, proper use of emergency dept and after hours care, as well as other concerns.     Recommendations: Continue to return yearly for your annual wellness and preventative care visits.  This gives [place] a chance to discuss healthy lifestyle, exercise, vaccinations, review your chart record, and perform screenings where appropriate.  I recommend you see your eye doctor yearly for routine vision care.  I recommend you see your dentist yearly for routine dental care including hygiene visits twice yearly.   Vaccination recommendations were reviewed Immunization History  Administered Date(s) Administered   Influenza Whole 05/11/2009   Influenza,inj,Quad PF,6+ Mos 06/02/2018, 05/03/2019   I recommend a yearly flu shot in the fall  I recommend tetanus booster every 10 years  He notes prior covid vaccines.   Screening for cancer: Colon cancer screening: I reviewed recent negative Cologard.  Repeat 3 years.  Testicular cancer screening You should do a monthly self testicular exam if you are between 66-30 years old  We discussed PSA, prostate exam, and prostate cancer screening risks/benefits.     Skin cancer screening: Check your skin regularly for new changes, growing lesions, or other lesions of concern Come in for evaluation if you have skin lesions of concern.  Lung cancer screening: If you have a greater than 20 pack year history of tobacco use, then you may qualify for lung cancer screening with a chest CT scan.   Please call your insurance company to inquire about coverage for this test.  We currently don't have screenings for other cancers besides breast, cervical, colon, and lung cancers.  If you have a strong family history of cancer or have other cancer screening concerns,  please let me know.    Bone health: Get at least 150 minutes of aerobic exercise weekly Get weight bearing exercise at least once weekly Bone density test:  A bone density test is an imaging test that uses a type of X-ray to measure the amount of calcium and other minerals in your bones. The test may be used to diagnose or screen you for a condition that causes weak or thin bones (osteoporosis), predict your risk for a broken bone (fracture), or determine how well your osteoporosis treatment is working. The bone density test is recommended for females 54 and older, or females or males <28 if certain risk factors such as thyroid disease, long term use of steroids such as for asthma or rheumatological issues, vitamin D deficiency, estrogen deficiency, family history of osteoporosis, self or family history of fragility fracture in first degree relative.    Heart health: Get at least 150 minutes of aerobic exercise weekly Limit alcohol It is important to maintain a healthy blood pressure and healthy cholesterol numbers  Heart disease screening: Screening for heart disease includes screening for blood pressure, fasting lipids, glucose/diabetes screening, BMI height to weight ratio, reviewed of smoking status, physical activity, and diet.    Goals include blood pressure 120/80 or less, maintaining a healthy lipid/cholesterol profile, preventing diabetes or keeping diabetes numbers under good control, not smoking or using tobacco products, exercising most days per week or at least 150 minutes per week of exercise, and eating healthy variety of fruits and vegetables, healthy oils, and avoiding unhealthy food choices like fried food, fast food, high sugar and high cholesterol foods.    Other  tests may possibly include EKG test, CT coronary calcium score, echocardiogram, exercise treadmill stress test.     Medical care options: I recommend you continue to seek care here first for routine care.  We  try really hard to have available appointments Monday through Friday daytime hours for sick visits, acute visits, and physicals.  Urgent care should be used for after hours and weekends for significant issues that cannot wait till the next day.  The emergency department should be used for significant potentially life-threatening emergencies.  The emergency department is expensive, can often have long wait times for less significant concerns, so try to utilize primary care, urgent care, or telemedicine when possible to avoid unnecessary trips to the emergency department.  Virtual visits and telemedicine have been introduced since the pandemic started in 2020, and can be convenient ways to receive medical care.  We offer virtual appointments as well to assist you in a variety of options to seek medical care.    Separate significant issues discussed: Obesity Exercise most days per week, continue with the trainer Work on dietary changes including limiting or avoiding junk food, high carb foods, large portions, heavy meat portions Set short-term goals including fitness goals Consider other medication options such as Qsymia, Saxenda or other.  We can use phentermine short-term but this is not a good long-term solution   Vitamin D deficiency Begin vitamin D 50,000 units weekly Foods that contain vitamin D include seafood such as oysters, shrimp, salmon, herring, cod, egg yolks, mushrooms, milk, and foods fortified with Vitamin D such as orange juice, cereals.  Its also important to get some sun exposure regularly to absorb vitamin D.    Arthritis Seeing orthopedics for this   Elevated blood pressures Start checking her blood pressures at home and write them down.  Check 2 or 3 days/week.  The goal is 120/70   Enlarged prostate on exam today Limit some of your fluid intake close to bedtime to prevent getting up at night to urinate

## 2021-02-16 LAB — CBC
Hematocrit: 44.4 % (ref 37.5–51.0)
Hemoglobin: 14.7 g/dL (ref 13.0–17.7)
MCH: 30.6 pg (ref 26.6–33.0)
MCHC: 33.1 g/dL (ref 31.5–35.7)
MCV: 93 fL (ref 79–97)
Platelets: 286 10*3/uL (ref 150–450)
RBC: 4.8 x10E6/uL (ref 4.14–5.80)
RDW: 13.7 % (ref 11.6–15.4)
WBC: 6.6 10*3/uL (ref 3.4–10.8)

## 2021-02-16 LAB — COMPREHENSIVE METABOLIC PANEL
ALT: 29 IU/L (ref 0–44)
AST: 30 IU/L (ref 0–40)
Albumin/Globulin Ratio: 1.8 (ref 1.2–2.2)
Albumin: 4.6 g/dL (ref 4.0–5.0)
Alkaline Phosphatase: 76 IU/L (ref 44–121)
BUN/Creatinine Ratio: 21 — ABNORMAL HIGH (ref 9–20)
BUN: 23 mg/dL (ref 6–24)
Bilirubin Total: 0.3 mg/dL (ref 0.0–1.2)
CO2: 20 mmol/L (ref 20–29)
Calcium: 9.6 mg/dL (ref 8.7–10.2)
Chloride: 100 mmol/L (ref 96–106)
Creatinine, Ser: 1.1 mg/dL (ref 0.76–1.27)
Globulin, Total: 2.6 g/dL (ref 1.5–4.5)
Glucose: 73 mg/dL (ref 65–99)
Potassium: 4.4 mmol/L (ref 3.5–5.2)
Sodium: 134 mmol/L (ref 134–144)
Total Protein: 7.2 g/dL (ref 6.0–8.5)
eGFR: 83 mL/min/{1.73_m2} (ref >59)

## 2021-02-16 LAB — LIPID PANEL
Chol/HDL Ratio: 4.1 ratio (ref 0.0–5.0)
Cholesterol, Total: 241 mg/dL — ABNORMAL HIGH (ref 100–199)
HDL: 59 mg/dL (ref >39)
LDL Chol Calc (NIH): 158 mg/dL — ABNORMAL HIGH (ref 0–99)
Triglycerides: 137 mg/dL (ref 0–149)
VLDL Cholesterol Cal: 24 mg/dL (ref 5–40)

## 2021-02-16 LAB — PSA: Prostate Specific Ag, Serum: 2.1 ng/mL (ref 0.0–4.0)

## 2021-02-16 LAB — TSH: TSH: 3.23 u[IU]/mL (ref 0.450–4.500)

## 2021-02-16 LAB — HEMOGLOBIN A1C
Est. average glucose Bld gHb Est-mCnc: 123 mg/dL
Hgb A1c MFr Bld: 5.9 % — ABNORMAL HIGH (ref 4.8–5.6)

## 2021-02-20 ENCOUNTER — Other Ambulatory Visit: Payer: Self-pay | Admitting: Medical

## 2021-02-20 ENCOUNTER — Encounter: Payer: Self-pay | Admitting: Orthopaedic Surgery

## 2021-02-20 ENCOUNTER — Encounter: Payer: Self-pay | Admitting: Family Medicine

## 2021-02-20 ENCOUNTER — Other Ambulatory Visit: Payer: Self-pay | Admitting: Physician Assistant

## 2021-02-20 ENCOUNTER — Encounter: Payer: Self-pay | Admitting: Medical

## 2021-02-20 DIAGNOSIS — R7301 Impaired fasting glucose: Secondary | ICD-10-CM | POA: Insufficient documentation

## 2021-02-20 MED ORDER — HYDROCODONE-ACETAMINOPHEN 5-325 MG PO TABS: 1.0000 | ORAL_TABLET | Freq: Every day | ORAL | 0 refills | Status: DC | PRN

## 2021-02-20 MED ORDER — QSYMIA 7.5-46 MG PO CP24: 1.0000 | ORAL_CAPSULE | ORAL | 1 refills | Status: DC

## 2021-02-20 NOTE — Telephone Encounter (Signed)
Sent in one small rx for norco

## 2021-02-21 ENCOUNTER — Telehealth: Payer: Self-pay | Admitting: Internal Medicine

## 2021-02-21 NOTE — Telephone Encounter (Signed)
P.A for Qsymia . Waiting on reply from [employer]eds.

## 2021-02-22 NOTE — Telephone Encounter (Signed)
Qsymia has been denied. I have put in for an appeal stating that patient has tried phentermine,topiramate and contrave. Just FYI. Waiting on a response from covermymeds

## 2021-02-25 ENCOUNTER — Other Ambulatory Visit: Payer: Self-pay | Admitting: Medical

## 2021-02-25 ENCOUNTER — Telehealth: Payer: Self-pay | Admitting: Medical

## 2021-02-25 NOTE — Telephone Encounter (Signed)
Please call patient.  Qsymia not covered by insurance.   We have started a prior authorization.  If the PA doesn't go through, then next step would be trial of Ozempic or Trulicity.

## 2021-02-26 ENCOUNTER — Other Ambulatory Visit: Payer: Self-pay | Admitting: Medical

## 2021-02-26 MED ORDER — PHENTERMINE HCL 37.5 MG PO TABS
37.5000 mg | ORAL_TABLET | Freq: Every day | ORAL | 0 refills | Status: DC
Start: 2021-02-26 — End: 2021-10-21

## 2021-02-26 NOTE — Telephone Encounter (Signed)
Called pt and informed and appeal process does take some time.  He asked if he can get a Rx for phentermine in the meantime? Michela Pitcher he was on it before

## 2021-03-10 NOTE — Telephone Encounter (Signed)
First appeal was denied for unusual reasons so I wrote a second letter of appeal.

## 2021-03-18 ENCOUNTER — Other Ambulatory Visit: Payer: Self-pay | Admitting: Medical

## 2021-03-19 ENCOUNTER — Other Ambulatory Visit: Payer: Self-pay

## 2021-03-19 ENCOUNTER — Encounter: Payer: Self-pay | Admitting: Orthopaedic Surgery

## 2021-03-19 ENCOUNTER — Ambulatory Visit (INDEPENDENT_AMBULATORY_CARE_PROVIDER_SITE_OTHER): Payer: No Typology Code available for payment source | Admitting: Orthopaedic Surgery

## 2021-03-19 DIAGNOSIS — M1712 Unilateral primary osteoarthritis, left knee: Secondary | ICD-10-CM | POA: Diagnosis not present

## 2021-03-19 DIAGNOSIS — M170 Bilateral primary osteoarthritis of knee: Secondary | ICD-10-CM

## 2021-03-19 DIAGNOSIS — M1711 Unilateral primary osteoarthritis, right knee: Secondary | ICD-10-CM | POA: Diagnosis not present

## 2021-03-19 MED ORDER — METHYLPREDNISOLONE ACETATE 40 MG/ML IJ SUSP
40.0000 mg | INTRAMUSCULAR | Status: AC | PRN
  Administered 2021-03-19: 40 mg via INTRA_ARTICULAR

## 2021-03-19 MED ORDER — BUPIVACAINE HCL 0.5 % IJ SOLN
2.0000 mL | INTRAMUSCULAR | Status: AC | PRN
  Administered 2021-03-19: 2 mL via INTRA_ARTICULAR

## 2021-03-19 MED ORDER — LIDOCAINE HCL 1 % IJ SOLN
2.0000 mL | INTRAMUSCULAR | Status: AC | PRN
  Administered 2021-03-19: 2 mL

## 2021-03-19 NOTE — Progress Notes (Signed)
Office Visit Note   Patient: Tony Waller           Date of Birth: 08-21-1970           MRN: [id number] Visit Date: 03/19/2021              Requested by: Carlena Hurl, PA-C [address] [place],  [place] [address] PCP: Carlena Hurl, PA-C   Assessment & Plan: Visit Diagnoses:  1. Primary osteoarthritis of right knee   2. Primary osteoarthritis of left knee     Plan: For now right knee is still getting relief from cortisone and Visco injection so we will continue to do these until he is ready for knee replacements.  Certainly he has severe degenerative joint disease that is worst in the patellofemoral compartment.  Right knee was aspirated and injected in the left knee was just injected with cortisone today.  I did recommend certain exercises that will most likely be more agreeable for his knees.  16 cc synovial fluid aspirated from right knee.  Follow-up as needed.  Follow-Up Instructions: Return if symptoms worsen or fail to improve.   Orders:  No orders of the defined types were placed in this encounter.  No orders of the defined types were placed in this encounter.     Procedures: Large Joint Inj: bilateral knee on 03/19/2021 2:46 PM Indications: pain Details: 22 G needle  Arthrogram: No  Medications (Right): 2 mL lidocaine 1 %; 2 mL bupivacaine 0.5 %; 40 mg methylPREDNISolone acetate 40 MG/ML Medications (Left): 2 mL lidocaine 1 %; 2 mL bupivacaine 0.5 %; 40 mg methylPREDNISolone acetate 40 MG/ML Outcome: tolerated well, no immediate complications Patient was prepped and draped in the usual sterile fashion.      Clinical Data: No additional findings.   Subjective: Chief Complaint  Patient presents with   Right Knee - Follow-up   Left Knee - Follow-up    Tony Waller comes back today for follow-up of bilateral knee pain.  Recently he has felt worsening in pain but he does admit to doing a lot of squatting and power cleans at the gym with his trainer and he  feels like this has likely flared up his knees.  He is requesting repeating cortisone injections in both knees.  He does feel some swelling in the right knee.   Review of Systems   Objective: Vital Signs: There were no vitals taken for this visit.  Physical Exam  Ortho Exam Bilateral knee exams are stable other than a medium joint effusion of the right knee. Specialty Comments:  No specialty comments available.  Imaging: No results found.   PMFS History: Patient Active Problem List   Diagnosis Date Noted   Impaired fasting blood sugar 02/20/2021   Encounter for health maintenance examination in adult 02/15/2021   Vitamin deficiency 02/15/2021   High risk medication use 02/15/2021   Elevated blood-pressure reading without diagnosis of hypertension 02/15/2021   Prostate enlargement 02/15/2021   Screening for heart disease 02/15/2021   Screening for diabetes mellitus 02/15/2021   Screening for prostate cancer 02/15/2021   Screening for thyroid disorder 02/15/2021   Vaccine counseling 02/15/2021   BMI 36.0-36.9,adult 11/01/2020   Osteoarthritis of both knees 11/01/2020   OSA (obstructive sleep apnea) 11/01/2020   Screen for colon cancer 11/01/2020   Arthrosis of left acromioclavicular joint 08/21/2020   Acute medial meniscus tear of left knee 04/13/2019   Stress fracture of left tibia 04/13/2019   ALLERGIC RHINITIS 05/28/2009  Past Medical History:  Diagnosis Date   ALLERGIC RHINITIS    Arthralgia    knees, shoulder   OSA (obstructive sleep apnea) 2011   on CPAP most nights   Wears contact lenses     Family History  Problem Relation Age of Onset   Allergies Mother    Diabetes Mother    Arthritis Mother    Lung cancer Paternal Grandfather    Cancer Paternal Grandfather    Brain cancer Maternal Uncle    Hyperlipidemia Father    Hypertension Father    Arthritis Brother    Stroke Maternal Grandmother    Heart disease Maternal Grandfather    Heart disease  Paternal Grandmother     Past Surgical History:  Procedure Laterality Date   BUNIONECTOMY Left    KNEE ARTHROSCOPY WITH SUBCHONDROPLASTY Left 06/22/2019   Procedure: LEFT KNEE ARTHROSCOPY WITH PARTIAL MEDIAL MENISCECTOMY, SUBCHONDROPLASTY AND EXCISION OF CYST;  Surgeon: Leandrew Koyanagi, MD;  Location: [facility];  Service: Orthopedics;  Laterality: Left;   NASAL SINUS SURGERY  2000   for deviated septum and turbonate reduction   ROTATOR CUFF REPAIR     right   TONSILLECTOMY  1994   Social History   Occupational History   Occupation: Scientist, product/process development: Theme park manager  Tobacco Use   Smoking status: Former    Types: Cigars   Smokeless tobacco: Never  Vaping Use   Vaping Use: Never used  Substance and Sexual Activity   Alcohol use: Yes    Alcohol/week: 4.0 standard drinks    Types: 4 Standard drinks or equivalent per week    Comment: occas   Drug use: Never   Sexual activity: Not on file

## 2021-03-22 ENCOUNTER — Encounter: Payer: Self-pay | Admitting: Orthopaedic Surgery

## 2021-03-27 ENCOUNTER — Other Ambulatory Visit: Payer: Self-pay | Admitting: Physician Assistant

## 2021-03-28 NOTE — Telephone Encounter (Signed)
Pt called in regarding a refill on either tramadol or ibuprofen 800 mg

## 2021-03-28 NOTE — Telephone Encounter (Signed)
Can you please advise since Erlinda Hong is out of the office.

## 2021-03-31 ENCOUNTER — Other Ambulatory Visit: Payer: Self-pay | Admitting: Physician Assistant

## 2021-03-31 MED ORDER — IBUPROFEN 800 MG PO TABS: 800.0000 mg | ORAL_TABLET | Freq: Three times a day (TID) | ORAL | 0 refills | Status: DC | PRN

## 2021-03-31 NOTE — Telephone Encounter (Signed)
Just checking my messages.  I was out Friday 8/12 and will be back this Tuesday 8/23.  Could you please let the patient know that I just now saw his message and have sent in ibuprofen.  Do not take with any other nsaids.  Please make sure staff knows to not send medication refills/advice and things like that to me until Tuesday when I am back.

## 2021-04-01 NOTE — Telephone Encounter (Signed)
Appeal for Qsymia approved til 06/19/21, left message for pt.  Activated discount card.  Called pharmacy went thru for $34 and will order and will be in tomorrow.

## 2021-04-19 ENCOUNTER — Ambulatory Visit: Payer: Self-pay

## 2021-04-19 ENCOUNTER — Other Ambulatory Visit: Payer: Self-pay

## 2021-04-19 ENCOUNTER — Ambulatory Visit (INDEPENDENT_AMBULATORY_CARE_PROVIDER_SITE_OTHER): Payer: No Typology Code available for payment source | Admitting: Orthopedic Surgery

## 2021-04-19 ENCOUNTER — Ambulatory Visit (INDEPENDENT_AMBULATORY_CARE_PROVIDER_SITE_OTHER): Payer: No Typology Code available for payment source

## 2021-04-19 DIAGNOSIS — M778 Other enthesopathies, not elsewhere classified: Secondary | ICD-10-CM | POA: Diagnosis not present

## 2021-04-19 DIAGNOSIS — M25531 Pain in right wrist: Secondary | ICD-10-CM | POA: Diagnosis not present

## 2021-04-19 MED ORDER — IBUPROFEN 800 MG PO TABS: 800.0000 mg | ORAL_TABLET | Freq: Three times a day (TID) | ORAL | 0 refills | Status: AC | PRN

## 2021-04-19 NOTE — Progress Notes (Signed)
Office Visit Note   Patient: Tony Waller           Date of Birth: [birth year]           MRN: [id number] Visit Date: 04/19/2021              Requested by: Carlena Hurl, PA-C [address] [place],  [place] [address] PCP: Carlena Hurl, PA-C   Assessment & Plan: Visit Diagnoses:  1. Pain in right wrist   2. Extensor carpi ulnaris tendinitis     Plan: Discussed with patient that his complaints and exam findings are most consistent with ECU tendonitis.  He does have some pain with palpation at the SL interval by no static or dynamic SL diastasis on imaging.  We discussed conservative management including NSAIDs, activity modification, and hand therapy with functional bracing.  We can see him back in 4-6 weeks if he's not improving.   Follow-Up Instructions: No follow-ups on file.   Orders:  Orders Placed This Encounter  Procedures   XR Wrist Complete Right   Ambulatory referral to Occupational Therapy   Meds ordered this encounter  Medications   ibuprofen (ADVIL) 800 MG tablet    Sig: Take 1 tablet (800 mg total) by mouth every 8 (eight) hours as needed for up to 14 days.    Dispense:  30 tablet    Refill:  0      Procedures: No procedures performed   Clinical Data: No additional findings.   Subjective: Chief Complaint  Patient presents with   Right Wrist - Pain    This is a 50 yo RHD M who presents w/ right wrist pain.  He was lifting weights about 3-4 weeks ago when he felt pain at the dorsal ulnar side of his wrist.  He was doing a jerk type move with sudden wrist extension.  The pain is sharp in nature.  He describes continued pain in the area since the injury.  He has been taking ibuprofen sparingly which has helped some.  He thinks he injured his wrist in a similar way as a kid but denies other trauma.   Review of Systems  Constitutional: Negative.   Respiratory: Negative.    Cardiovascular: Negative.   Skin: Negative.   Neurological: Negative.      Objective: Vital Signs: There were no vitals taken for this visit.  Physical Exam Constitutional:      Appearance: Normal appearance.  Cardiovascular:     Rate and Rhythm: Normal rate.     Pulses: Normal pulses.  Skin:    General: Skin is warm and dry.     Capillary Refill: Capillary refill takes 2 to 3 seconds.  Neurological:     General: No focal deficit present.     Mental Status: He is alert.    Right Hand Exam   Tenderness  Right hand tenderness location: TTP along ECU tendon and dorsally at SL interval. No swelling.  Range of Motion  Wrist  Right wrist extension: 60.  Right wrist flexion: 40.  Pronation:  normal  Supination:  normal   Muscle Strength  The patient has normal right wrist strength.  Other  Erythema: absent Sensation: normal Pulse: present  Comments:  + ECU synergy test.  No apparent ECU instability.  No DRUJ instability.  Negative Watson scaphoid shift test.      Specialty Comments:  No specialty comments available.  Imaging: 3 views of the right wrist and a bilateral pencil grip view  taken today are reviewed interpreted by me.  They demonstrate no evidence of either static or dynamic SL widening.  No evidence of fracture or dislocation.  There is no evidence of degenerative changes.    PMFS History: Patient Active Problem List   Diagnosis Date Noted   Impaired fasting blood sugar 02/20/2021   Encounter for health maintenance examination in adult 02/15/2021   Vitamin deficiency 02/15/2021   High risk medication use 02/15/2021   Elevated blood-pressure reading without diagnosis of hypertension 02/15/2021   Prostate enlargement 02/15/2021   Screening for heart disease 02/15/2021   Screening for diabetes mellitus 02/15/2021   Screening for prostate cancer 02/15/2021   Screening for thyroid disorder 02/15/2021   Vaccine counseling 02/15/2021   BMI 36.0-36.9,adult 11/01/2020   Osteoarthritis of both knees 11/01/2020   OSA  (obstructive sleep apnea) 11/01/2020   Screen for colon cancer 11/01/2020   Arthrosis of left acromioclavicular joint 08/21/2020   Acute medial meniscus tear of left knee 04/13/2019   Stress fracture of left tibia 04/13/2019   ALLERGIC RHINITIS 05/28/2009   Past Medical History:  Diagnosis Date   ALLERGIC RHINITIS    Arthralgia    knees, shoulder   OSA (obstructive sleep apnea) 2011   on CPAP most nights   Wears contact lenses     Family History  Problem Relation Age of Onset   Allergies Mother    Diabetes Mother    Arthritis Mother    Lung cancer Paternal Grandfather    Cancer Paternal Grandfather    Brain cancer Maternal Uncle    Hyperlipidemia Father    Hypertension Father    Arthritis Brother    Stroke Maternal Grandmother    Heart disease Maternal Grandfather    Heart disease Paternal Grandmother     Past Surgical History:  Procedure Laterality Date   BUNIONECTOMY Left    KNEE ARTHROSCOPY WITH SUBCHONDROPLASTY Left 06/22/2019   Procedure: LEFT KNEE ARTHROSCOPY WITH PARTIAL MEDIAL MENISCECTOMY, SUBCHONDROPLASTY AND EXCISION OF CYST;  Surgeon: Leandrew Koyanagi, MD;  Location: [facility];  Service: Orthopedics;  Laterality: Left;   NASAL SINUS SURGERY  2000   for deviated septum and turbonate reduction   ROTATOR CUFF REPAIR     right   TONSILLECTOMY  1994   Social History   Occupational History   Occupation: Scientist, product/process development: Theme park manager  Tobacco Use   Smoking status: Former    Types: Cigars   Smokeless tobacco: Never  Vaping Use   Vaping Use: Never used  Substance and Sexual Activity   Alcohol use: Yes    Alcohol/week: 4.0 standard drinks    Types: 4 Standard drinks or equivalent per week    Comment: occas   Drug use: Never   Sexual activity: Not on file

## 2021-04-23 ENCOUNTER — Encounter: Payer: Self-pay | Admitting: Occupational Therapy

## 2021-04-23 ENCOUNTER — Other Ambulatory Visit: Payer: Self-pay

## 2021-04-23 ENCOUNTER — Ambulatory Visit (INDEPENDENT_AMBULATORY_CARE_PROVIDER_SITE_OTHER): Payer: No Typology Code available for payment source | Admitting: Occupational Therapy

## 2021-04-23 DIAGNOSIS — M25631 Stiffness of right wrist, not elsewhere classified: Secondary | ICD-10-CM | POA: Diagnosis not present

## 2021-04-23 DIAGNOSIS — M25531 Pain in right wrist: Secondary | ICD-10-CM

## 2021-04-23 DIAGNOSIS — M6281 Muscle weakness (generalized): Secondary | ICD-10-CM | POA: Diagnosis not present

## 2021-04-23 DIAGNOSIS — R278 Other lack of coordination: Secondary | ICD-10-CM | POA: Diagnosis not present

## 2021-04-23 NOTE — Patient Instructions (Signed)
Wrist Extensor Stretch    Keeping elbow straight, grasp left hand and slowly bend wrist forward until stretch is felt. Hold _3-5_ seconds. Relax. Repeat _5-10__ times per set. Do __3-5__ sessions per day.  Also do the stretch above while using your left hand to turn/stretch your right wrist toward your midline/ECU stretch.  Use ice and Advil as prescribed by Dr Tempie Donning

## 2021-04-23 NOTE — Therapy (Signed)
[facility] [address] [place], [place], [address] Phone: [phone]   Fax:  [phone]  Occupational Therapy Evaluation  Patient Details  Name: Tony Waller MRN: [id number] Date of Birth: Jun 10, 1971 Referring Provider (OT): Dr Tempie Donning   Encounter Date: 04/23/2021   OT End of Session - 04/23/21 1128     Visit Number 1    Number of Visits 9   Eval +8 visits   Date for OT Re-Evaluation 07/02/21   Every 10 visits   Authorization Type [employer] 20% Co-insurance    Authorization - Number of Visits 1    Progress Note Due on Visit 9   Eval + 8 visits   OT Start Time 1016    OT Stop Time 1059    OT Time Calculation (min) 43 min    Activity Tolerance Patient tolerated treatment well;No increased pain    Behavior During Therapy WFL for tasks assessed/performed             Past Medical History:  Diagnosis Date   ALLERGIC RHINITIS    Arthralgia    knees, shoulder   Arthritis    OSA (obstructive sleep apnea) 2011   on CPAP most nights   Wears contact lenses     Past Surgical History:  Procedure Laterality Date   BUNIONECTOMY Left    KNEE ARTHROSCOPY WITH SUBCHONDROPLASTY Left 06/22/2019   Procedure: LEFT KNEE ARTHROSCOPY WITH PARTIAL MEDIAL MENISCECTOMY, SUBCHONDROPLASTY AND EXCISION OF CYST;  Surgeon: Leandrew Koyanagi, MD;  Location: [facility];  Service: Orthopedics;  Laterality: Left;   NASAL SINUS SURGERY  2000   for deviated septum and turbonate reduction   ROTATOR CUFF REPAIR     right   TONSILLECTOMY  1994    There were no vitals filed for this visit.   Subjective Assessment - 04/23/21 1016     Subjective  Right hand ECU tendonitis. Pt reports that his initial injury occured about 5 weeks ago now when he was lifting weights and he felt pain in his dorsal ulnar side of his wrist.    Pertinent History Osteoarthitis in bilateral knees    Patient Stated Goals Get rid of pain and get back to normal use of his right  dominant hand    Currently in Pain? Yes    Pain Score 0-No pain    Aggravating Factors  Grip and extension of the wrist appears to aggrivate it as well as push/pull activity    Pain Relieving Factors Rest, ice, advil medication    Multiple Pain Sites No               [facility] OT Assessment - 04/23/21 0001       Assessment   Medical Diagnosis Right ECU Tendonitis    Referring Provider (OT) Dr Tempie Donning    Onset Date/Surgical Date 03/19/21   5 weeks ago   Hand Dominance Right    Next MD Visit Return PRN, if pain doesn't subside    Prior Therapy No      Precautions   Precautions None      Restrictions   Weight Bearing Restrictions No      Balance Screen   Has the patient fallen in the past 6 months No    Has the patient had a decrease in activity level because of a fear of falling?  No    Is the patient reluctant to leave their home because of a fear of falling?  No  Home  Environment   Family/patient expects to be discharged to: Private residence      Prior Function   Level of Independence Independent    Vocation Full time employment   Veterinary surgeon, computer work.   Vocation Requirements Computer work    Leisure Working [employer], watch football, listen to music      ADL   ADL comments Using Conservation officer, nature aggrivates symptoms, it initially impacted daily activities but does not anywmore (ADL's and work related activity). He states that he is independent with all ADL's at this time.      Mobility   Mobility Status Independent      Written Expression   Dominant Hand Right      Activity Tolerance   Activity Tolerance Comments Pt reports that he purchased a neoprene wrist support to use when working out. This splint/wrist wrap allows for A/ROM but provides support and as a reminder to avoid terminal grip with wrist extension. 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 Pt verbalized understanding of this in the clinic today. Pt was also educated in forearm stretches for right forearm/extensors and ECU. He will perform these stretches 3-4 times per day x5-10 reps each, hold for a count of 3-5 seconds to allow for a stretch. Discussed avoidance of sustained grip and grip with wrist extension as this will irritate symptoms. Again, he verbalized undrestanding in the clinic today after demonstration and performance      Cognition   Overall Cognitive Status Within Functional Limits for tasks assessed      Observation/Other Assessments   Observations Pt does not have any edema noted right dorsal wrist as compared to left non-injured side      Sensation   Light Touch Appears Intact      Coordination   Gross Motor Movements are Fluid and Coordinated Yes    Fine Motor Movements are Fluid and Coordinated No      Edema   Edema None noted      ROM / Strength   AROM / PROM / Strength AROM;Strength      Palpation   Palpation comment Pt with noted TTP of ECU and dorsal forearm/extensors.      AROM   Overall AROM  Deficits    AROM Assessment Site Wrist    Right/Left Wrist Left    Right Wrist Extension 60 Degrees    Right Wrist Flexion 50 Degrees    Left Wrist Extension 60 Degrees    Left Wrist Flexion 55 Degrees      Strength   Overall Strength Within functional limits for tasks performed    Overall Strength Comments Wrist extensors are WFL's with c/o TTP along ECU    Strength Assessment Site Wrist      Hand Function   Right Hand Gross Grasp Impaired   Pain with grip in ECU area and dorsal wrist.   Right Hand Grip (lbs) 109    Right Hand Lateral Pinch 22 lbs    Right Hand 3 Point Pinch 19 lbs    Left Hand Grip (lbs) 124.6    Left Hand Lateral Pinch 20 lbs    Left 3 point pinch 21 lbs    Comment General palpation of right dorsal wrist indicates TTP along ECU and dorsal forearm and wrist extensors. Pt also with pain with resistance to ECU and  w/ wrist extension testing.  OT Treatments/Exercises (OP) - 04/23/21 0001       Modalities   Modalities Ultrasound      Ultrasound   Ultrasound Location Right ECU/dorsal forearm/hand    Ultrasound Parameters 3 mHz @ 0.8 W/cm2 x52mn    Ultrasound Goals Edema;Pain      Splinting   Splinting See notes above in Activity Tolerance" section of Evaluation              Keeping elbow straight, grasp left hand and slowly bend wrist forward until stretch is felt. Hold _3-5_ seconds. Relax. Repeat _5-10__ times per set. Do __3-5__ sessions per day.  Also do the stretch above while using your left hand to turn/stretch your right wrist toward your midline or thumb/ECU stretch.  Use ice and Advil as prescribed by Dr BTempie Donning  OT Education - 04/23/21 1126     Education Details HEP/forearm stretches, use cryotherapy and Advil as prescribed by Dr BTempie Donning avoid grip with wrist extension, try and maintain neutral wrist with activities as discussed.    Person(s) Educated Patient    Methods Explanation;Demonstration    Comprehension Verbalized understanding;Need further instruction              OT Short Term Goals - 04/23/21 1140       OT SHORT TERM GOAL #1   Title Pt will be Mod I initial  HEP Right UE    Time 4    Period Weeks    Status New    Target Date 05/21/21      OT SHORT TERM GOAL #2   Title Pt will be Mod I with activity modification for work and leisure (Right Neutral wrist)    Time 4    Period Weeks    Status New    Target Date 05/21/21      OT SHORT TERM GOAL #3   Title Pt will be Mod I splinting use, care and precautions R wrist PRN    Time 4    Period Weeks    Status New    Target Date 05/21/21               OT Long Term Goals - 04/23/21 1143       OT LONG TERM GOAL #1   Title Pt will be Mod I upgraded HEP right wrist/hand    Time 8    Period Weeks    Status New    Target Date 06/18/21      OT LONG TERM GOAL #2    Title Pt will demonstrate improved grip Right wrist as seen by gain of 5-10# per JAMAR dynamometer assessment    Time 8    Period Weeks    Status New    Target Date 06/18/21      OT LONG TERM GOAL #3   Title Pt will report pain free return to work and physical activity as seen by pain rating of 3/10 or less    Time 8    Period Weeks    Status New    Target Date 06/18/21      OT LONG TERM GOAL #4   Title Update and progress all goals as appropriate                   Plan - 04/23/21 1130     Clinical Impression Statement Pt is a 50y/o R HD male with a non-significant PMH per his report. He presents today per Dr BTempie Donningfollowing pain  in his right wrist after lifting weights about 5 weeks ago now. He indicates that his pain is in the dorsal ulnar side of his wrist. He is positive for pain with provocative testing and resistance to his ECU on the right. He does not appear to have any edema when compared to his left non-injured side. He reports that his pain/symptoms have been getting better slowly over the last few weeks. He presents with pain with increased  activity, decreased strength. He purchased a right neoprene wrist support which he uses when lifting weights. He was educated to avoid sustained grip and grip with wrist extension. Neutral wrist during functional activity was discussed and examples were provided. He should benefit from a HEP (eventual strengthening), stretching, sypmtom management, pulsed [place] and pt education.    OT Occupational Profile and History Problem Focused Assessment - Including review of records relating to presenting problem    Occupational performance deficits (Please refer to evaluation for details): ADL's;IADL's;Work    Body Structure / Function / Physical Skills ADL;Strength;Pain;Dexterity;UE functional use;ROM;Flexibility;Mobility;Decreased knowledge of precautions    Rehab Potential Good    Clinical Decision Making Limited treatment options, no  task modification necessary    Comorbidities Affecting Occupational Performance: None    Modification or Assistance to Complete Evaluation  No modification of tasks or assist necessary to complete eval    OT Frequency 1x / week    OT Duration 8 weeks   8 visits + Eval   OT Treatment/Interventions Self-care/ADL training;Fluidtherapy;Splinting;Therapeutic activities;Ultrasound;Therapeutic exercise;Cryotherapy;Iontophoresis;Passive range of motion;Paraffin;Manual Therapy;Patient/family education    Plan Review & upgrade HEP, pulsed [place] ECU right, check neoprene wrist splint (pt to bring). ?Consider Intophoresis    Consulted and Agree with Plan of Care Patient             Patient will benefit from skilled therapeutic intervention in order to improve the following deficits and impairments:   Body Structure / Function / Physical Skills: ADL, Strength, Pain, Dexterity, UE functional use, ROM, Flexibility, Mobility, Decreased knowledge of precautions       Visit Diagnosis: Pain in right wrist - Plan: Ot plan of care cert/re-cert  Muscle weakness (generalized) - Plan: Ot plan of care cert/re-cert  Other lack of coordination - Plan: Ot plan of care cert/re-cert  Stiffness of right wrist, not elsewhere classified - Plan: Ot plan of care cert/re-cert    Problem List Patient Active Problem List   Diagnosis Date Noted   Impaired fasting blood sugar 02/20/2021   Encounter for health maintenance examination in adult 02/15/2021   Vitamin deficiency 02/15/2021   High risk medication use 02/15/2021   Elevated blood-pressure reading without diagnosis of hypertension 02/15/2021   Prostate enlargement 02/15/2021   Screening for heart disease 02/15/2021   Screening for diabetes mellitus 02/15/2021   Screening for prostate cancer 02/15/2021   Screening for thyroid disorder 02/15/2021   Vaccine counseling 02/15/2021   BMI 36.0-36.9,adult 11/01/2020   Osteoarthritis of both knees 11/01/2020    OSA (obstructive sleep apnea) 11/01/2020   Screen for colon cancer 11/01/2020   Arthrosis of left acromioclavicular joint 08/21/2020   Acute medial meniscus tear of left knee 04/13/2019   Stress fracture of left tibia 04/13/2019   ALLERGIC RHINITIS 05/28/2009    Almyra Deforest, OT/L 04/23/2021, 11:54 AM  [facility] Physical Therapy [address] [place], [place], [address] Phone: [phone]   Fax:  [phone]  Name: Adebayo Ensminger MRN: [id number] Date of Birth: February 23, 1971

## 2021-05-02 ENCOUNTER — Encounter: Payer: No Typology Code available for payment source | Admitting: Occupational Therapy

## 2021-05-10 ENCOUNTER — Encounter: Payer: Self-pay | Admitting: Orthopaedic Surgery

## 2021-05-10 ENCOUNTER — Other Ambulatory Visit: Payer: Self-pay | Admitting: Physician Assistant

## 2021-05-10 MED ORDER — IBUPROFEN 800 MG PO TABS: 800.0000 mg | ORAL_TABLET | Freq: Three times a day (TID) | ORAL | 2 refills | Status: DC | PRN

## 2021-05-10 NOTE — Telephone Encounter (Signed)
Sent in

## 2021-05-16 ENCOUNTER — Other Ambulatory Visit: Payer: Self-pay

## 2021-05-16 ENCOUNTER — Encounter: Payer: Self-pay | Admitting: Occupational Therapy

## 2021-05-16 ENCOUNTER — Ambulatory Visit (INDEPENDENT_AMBULATORY_CARE_PROVIDER_SITE_OTHER): Payer: No Typology Code available for payment source | Admitting: Occupational Therapy

## 2021-05-16 DIAGNOSIS — M25531 Pain in right wrist: Secondary | ICD-10-CM | POA: Diagnosis not present

## 2021-05-16 DIAGNOSIS — R278 Other lack of coordination: Secondary | ICD-10-CM

## 2021-05-16 DIAGNOSIS — M25631 Stiffness of right wrist, not elsewhere classified: Secondary | ICD-10-CM | POA: Diagnosis not present

## 2021-05-16 DIAGNOSIS — M6281 Muscle weakness (generalized): Secondary | ICD-10-CM | POA: Diagnosis not present

## 2021-05-16 NOTE — Therapy (Addendum)
[facility] Physical Therapy [address] [place], [place], [address] Phone: [phone]   Fax:  [phone]  Occupational Therapy Treatment & Discharge Summary (07/25/21)  Patient Details  Name: Tony Waller MRN: [id number] Date of Birth: 1970/09/07 Referring Provider (OT): Dr Tempie Donning  OCCUPATIONAL THERAPY DISCHARGE SUMMARY  07/25/21 Visits from Start of Care: 2  Current functional level related to goals / functional outcomes: At last visit, pt pain was improving per his report (from 8-9/10 decreased to 4/10). He was Mod I with his home program and general activity modification techniques. He had met 2/3 STG's and did not return to therapy for follow up visits.   Remaining deficits: Unknown   Education / Equipment: Activity modification, home program, Iontophoresis, pt education/ADL's.   Patient agrees to discharge. Patient goals were partially met. Patient is being discharged due to not returning since the last visit..     Encounter Date: 05/16/2021   OT End of Session - 05/16/21 1057     Visit Number 2    Number of Visits 9   +8 visits   Date for OT Re-Evaluation 07/02/21   Every 10 visits   Authorization Type [employer] 20% Co-insurance    Authorization - Number of Visits 2    Progress Note Due on Visit 9   Eval +8 visits   OT Start Time 1000    OT Stop Time 1038    OT Time Calculation (min) 38 min    Activity Tolerance Patient tolerated treatment well;No increased pain    Behavior During Therapy WFL for tasks assessed/performed             Past Medical History:  Diagnosis Date   ALLERGIC RHINITIS    Arthralgia    knees, shoulder   Arthritis    OSA (obstructive sleep apnea) 2011   on CPAP most nights   Wears contact lenses     Past Surgical History:  Procedure Laterality Date   BUNIONECTOMY Left    KNEE ARTHROSCOPY WITH SUBCHONDROPLASTY Left 06/22/2019   Procedure: LEFT KNEE ARTHROSCOPY WITH PARTIAL MEDIAL MENISCECTOMY,  SUBCHONDROPLASTY AND EXCISION OF CYST;  Surgeon: Leandrew Koyanagi, MD;  Location: [place];  Service: Orthopedics;  Laterality: Left;   NASAL SINUS SURGERY  2000   for deviated septum and turbonate reduction   ROTATOR CUFF REPAIR     right   TONSILLECTOMY  1994    There were no vitals filed for this visit.   Subjective Assessment - 05/16/21 1007     Subjective  Right hand ECU tendonitits, Pt reports stretches are helping and activity modifications/right wrist in neutral is also helping his symptoms. He reports pain as 8-9/10 the last time her saw MD and now 4/10.    Pertinent History Osteoarthitis in bilateral knees    Patient Stated Goals Get rid of pain and get back to normal use of his right dominant hand    Currently in Pain? Yes    Pain Score 4     Pain Location Hand    Pain Orientation Right    Pain Descriptors / Indicators Aching;Throbbing    Pain Type Acute pain    Pain Onset More than a month ago    Pain Frequency Intermittent    Multiple Pain Sites No                OT Treatments/Exercises (OP) - 05/16/21 0001       ADLs   ADL Comments Pt reports decreased pain in right  ECU. Pt reports using neutral wrist techniques during ADL's and functional activity as well as for work and while working out. He is using his right wrist wrap that he had previoulsy purchased as this helps him to remember to avoid sustained grip and grip with wrist extension/UD. Pt also reports that his pain was 8-9/10 at his last MD visits and today rates it as 4/10.   Pt education in Iontophoresis, use, care and precautions. Pt to remove patch at home after     Exercises   Exercises Hand;Wrist      Wrist Exercises   Other wrist exercises ECU stretches including elbow extension w/ passive right wrist flexion and RD to stretch ECU in wrist/hand/forearm x5-10 reps hold x10 seconds each    Other wrist exercises Extensor forearm stretches right.      Modalities   Modalities  Ultrasound;Iontophoresis      Ultrasound   Ultrasound Location Right ECU/dorsal forearm/hand    Ultrasound Parameters 3 mHz @ 0.8 W/cm2 x65mn    Ultrasound Goals Edema;Pain      Iontophoresis   Type of Iontophoresis Dexamethasone    Location Right ECU - distal/dorsal wrist    Dose 176m   Time Patch   8 min. Pt education to remove patch after 4 hours     Manual Therapy   Manual Therapy Soft tissue mobilization   Cross friction massage right ECU (hand/dorsal wrist and forearm) to assist with increased blood flow and circulation, decreased pain x8m97m  Soft tissue mobilization See notes above               OT Education - 05/16/21 1056     Education Details Review HEP/forearm extensors and ECU stretches, Iontophoresis use, care and precautions, use cryotherapy and Advil as prescribed by Dr BenTempie Donningctivitity modification as discussed.    Person(s) Educated Patient    Methods Explanation;Demonstration    Comprehension Verbalized understanding;Need further instruction              OT Short Term Goals - 05/16/21 1102       OT SHORT TERM GOAL #1   Title Pt will be Mod I initial  HEP Right UE    Time 4    Period Weeks    Status Achieved    Target Date 05/21/21      OT SHORT TERM GOAL #2   Title Pt will be Mod I with activity modification for work and leisure (Right Neutral wrist)    Time 4    Period Weeks    Status Achieved    Target Date 05/21/21      OT SHORT TERM GOAL #3   Title Pt will be Mod I splinting use, care and precautions R wrist PRN    Time 4    Period Weeks    Status On-going    Target Date 05/21/21               OT Long Term Goals - 04/23/21 1143       OT LONG TERM GOAL #1   Title Pt will be Mod I upgraded HEP right wrist/hand    Time 8    Period Weeks    Status New    Target Date 06/18/21      OT LONG TERM GOAL #2   Title Pt will demonstrate improved grip Right wrist as seen by gain of 5-10# per JAMAR dynamometer assessment     Time 8    Period Weeks  Status New    Target Date 06/18/21      OT LONG TERM GOAL #3   Title Pt will report pain free return to work and physical activity as seen by pain rating of 3/10 or less    Time 8    Period Weeks    Status New    Target Date 06/18/21      OT LONG TERM GOAL #4   Title Update and progress all goals as appropriate               Plan - 05/16/21 1058     Clinical Impression Statement Pt reports decreased pain right hand/wrist/ECU as compared to MD last visit. Pt is currently rating pain as 4/10 as compared to initial MD visit of 8-9/10, right ECU and dorsal wrist. Pt appears to be Mod I forearm stretches and ECU/activity modification techniques as oberseved in clinic today. Pt should also benefit from IIontophoresis R dorsal/wrist/hand ECU. Pt was educated in use, care and precautions and he will remove Ionto patch at home later today.    OT Occupational Profile and History Problem Focused Assessment - Including review of records relating to presenting problem    Occupational performance deficits (Please refer to evaluation for details): ADL's;IADL's;Work    Body Structure / Function / Physical Skills ADL;Strength;Pain;Dexterity;UE functional use;ROM;Flexibility;Mobility;Decreased knowledge of precautions    Rehab Potential Good    Clinical Decision Making Limited treatment options, no task modification necessary    Comorbidities Affecting Occupational Performance: None    Modification or Assistance to Complete Evaluation  No modification of tasks or assist necessary to complete eval    OT Frequency 1x / week    OT Duration 8 weeks   Eval +8 visits = 9 total   OT Treatment/Interventions Self-care/ADL training;Fluidtherapy;Splinting;Therapeutic activities;Ultrasound;Therapeutic exercise;Cryotherapy;Iontophoresis;Passive range of motion;Paraffin;Manual Therapy;Patient/family education    Plan Review & upgrade HEP, pulsed [place] ECU right, activity modification,  Intophoresis ECU right.    Consulted and Agree with Plan of Care Patient             Patient will benefit from skilled therapeutic intervention in order to improve the following deficits and impairments:   Body Structure / Function / Physical Skills: ADL, Strength, Pain, Dexterity, UE functional use, ROM, Flexibility, Mobility, Decreased knowledge of precautions       Visit Diagnosis: Pain in right wrist  Muscle weakness (generalized)  Other lack of coordination  Stiffness of right wrist, not elsewhere classified    Problem List Patient Active Problem List   Diagnosis Date Noted   Impaired fasting blood sugar 02/20/2021   Encounter for health maintenance examination in adult 02/15/2021   Vitamin deficiency 02/15/2021   High risk medication use 02/15/2021   Elevated blood-pressure reading without diagnosis of hypertension 02/15/2021   Prostate enlargement 02/15/2021   Screening for heart disease 02/15/2021   Screening for diabetes mellitus 02/15/2021   Screening for prostate cancer 02/15/2021   Screening for thyroid disorder 02/15/2021   Vaccine counseling 02/15/2021   BMI 36.0-36.9,adult 11/01/2020   Osteoarthritis of both knees 11/01/2020   OSA (obstructive sleep apnea) 11/01/2020   Screen for colon cancer 11/01/2020   Arthrosis of left acromioclavicular joint 08/21/2020   Acute medial meniscus tear of left knee 04/13/2019   Stress fracture of left tibia 04/13/2019   ALLERGIC RHINITIS 05/28/2009    Almyra Deforest, OT/L 05/16/2021, 11:05 AM  [facility] Therapy [address] [place], [place], [address] Phone: [phone]   Fax:  [phone]  Name: Tony Waller MRN: [id number] Date of Birth: 1971/04/05

## 2021-05-23 ENCOUNTER — Encounter: Payer: No Typology Code available for payment source | Admitting: Occupational Therapy

## 2021-05-30 ENCOUNTER — Telehealth: Payer: Self-pay

## 2021-05-30 ENCOUNTER — Encounter: Payer: No Typology Code available for payment source | Admitting: Occupational Therapy

## 2021-05-30 NOTE — Telephone Encounter (Signed)
VOB submitted for SynviscOne, bilateral knee. Pending BV.

## 2021-05-30 NOTE — Telephone Encounter (Signed)
Noted.  Will submit.

## 2021-05-31 ENCOUNTER — Other Ambulatory Visit: Payer: Self-pay

## 2021-05-31 ENCOUNTER — Encounter: Payer: Self-pay | Admitting: Orthopaedic Surgery

## 2021-05-31 ENCOUNTER — Telehealth: Payer: Self-pay

## 2021-05-31 ENCOUNTER — Ambulatory Visit (INDEPENDENT_AMBULATORY_CARE_PROVIDER_SITE_OTHER): Payer: No Typology Code available for payment source | Admitting: Orthopaedic Surgery

## 2021-05-31 DIAGNOSIS — M1712 Unilateral primary osteoarthritis, left knee: Secondary | ICD-10-CM

## 2021-05-31 MED ORDER — DICLOFENAC SODIUM 2 % EX SOLN: 2.0000 g | Freq: Two times a day (BID) | CUTANEOUS | 3 refills | Status: DC | PRN

## 2021-05-31 NOTE — Telephone Encounter (Signed)
Please submit for Bil knee gel inj's

## 2021-05-31 NOTE — Progress Notes (Signed)
Office Visit Note   Patient: Tony Waller           Date of Birth: Feb 25, 1971           MRN: [id number] Visit Date: 05/31/2021              Requested by: Carlena Hurl, PA-C [address] [place],  [place] [address] PCP: Carlena Hurl, PA-C   Assessment & Plan: Visit Diagnoses:  1. Primary osteoarthritis of left knee     Plan: Impression is left knee OA and contusion.  Recommend continuing symptomatic treatment.  We will send in prescription for Pennsaid.  We will also get authorization for gel injections again.  In the meantime he should try some icy hot with lidocaine topical cream.  This patient is diagnosed with osteoarthritis of the knee(s).    Radiographs show evidence of joint space narrowing, osteophytes, subchondral sclerosis and/or subchondral cysts.  This patient has knee pain which interferes with functional and activities of daily living.    This patient has experienced inadequate response, adverse effects and/or intolerance with conservative treatments such as acetaminophen, NSAIDS, topical creams, physical therapy or regular exercise, knee bracing and/or weight loss.   This patient has experienced inadequate response or has a contraindication to intra articular steroid injections for at least 3 months.   This patient is not scheduled to have a total knee replacement within 6 months of starting treatment with viscosupplementation.  Follow-Up Instructions: No follow-ups on file.   Orders:  No orders of the defined types were placed in this encounter.  Meds ordered this encounter  Medications   Diclofenac Sodium (PENNSAID) 2 % SOLN    Sig: Apply 2 g topically 2 (two) times daily as needed (to affected area).    Dispense:  112 g    Refill:  3      Procedures: No procedures performed   Clinical Data: No additional findings.   Subjective: Chief Complaint  Patient presents with   Left Knee - Pain    Right knee comes in today for left knee pain.   He has known osteoarthritis.  Voltaren gel does not provide much relief.  Recently had a snack cart hit his knee while he was traveling on an airplane.  He also would like to get gel injections pre-CERT again.   Review of Systems   Objective: Vital Signs: There were no vitals taken for this visit.  Physical Exam  Ortho Exam  Left knee shows no joint effusion.  Tenderness directly to the inferior pole of the patella.  He is able to walk and fully range his knee without significant difficulty or pain.  Specialty Comments:  No specialty comments available.  Imaging: No results found.   PMFS History: Patient Active Problem List   Diagnosis Date Noted   Impaired fasting blood sugar 02/20/2021   Encounter for health maintenance examination in adult 02/15/2021   Vitamin deficiency 02/15/2021   High risk medication use 02/15/2021   Elevated blood-pressure reading without diagnosis of hypertension 02/15/2021   Prostate enlargement 02/15/2021   Screening for heart disease 02/15/2021   Screening for diabetes mellitus 02/15/2021   Screening for prostate cancer 02/15/2021   Screening for thyroid disorder 02/15/2021   Vaccine counseling 02/15/2021   BMI 36.0-36.9,adult 11/01/2020   Osteoarthritis of both knees 11/01/2020   OSA (obstructive sleep apnea) 11/01/2020   Screen for colon cancer 11/01/2020   Arthrosis of left acromioclavicular joint 08/21/2020   Acute medial meniscus tear of  left knee 04/13/2019   Stress fracture of left tibia 04/13/2019   ALLERGIC RHINITIS 05/28/2009   Past Medical History:  Diagnosis Date   ALLERGIC RHINITIS    Arthralgia    knees, shoulder   Arthritis    OSA (obstructive sleep apnea) 2011   on CPAP most nights   Wears contact lenses     Family History  Problem Relation Age of Onset   Allergies Mother    Diabetes Mother    Arthritis Mother    Lung cancer Paternal Grandfather    Cancer Paternal Grandfather    Brain cancer Maternal Uncle     Hyperlipidemia Father    Hypertension Father    Arthritis Brother    Stroke Maternal Grandmother    Heart disease Maternal Grandfather    Heart disease Paternal Grandmother     Past Surgical History:  Procedure Laterality Date   BUNIONECTOMY Left    KNEE ARTHROSCOPY WITH SUBCHONDROPLASTY Left 06/22/2019   Procedure: LEFT KNEE ARTHROSCOPY WITH PARTIAL MEDIAL MENISCECTOMY, SUBCHONDROPLASTY AND EXCISION OF CYST;  Surgeon: Leandrew Koyanagi, MD;  Location: [place];  Service: Orthopedics;  Laterality: Left;   NASAL SINUS SURGERY  2000   for deviated septum and turbonate reduction   ROTATOR CUFF REPAIR     right   TONSILLECTOMY  1994   Social History   Occupational History   Occupation: Scientist, product/process development: [employer]  Tobacco Use   Smoking status: Former    Types: Cigars   Smokeless tobacco: Never  Vaping Use   Vaping Use: Never used  Substance and Sexual Activity   Alcohol use: Yes    Alcohol/week: 4.0 standard drinks    Types: 4 Standard drinks or equivalent per week    Comment: occas   Drug use: Never   Sexual activity: Not on file

## 2021-05-31 NOTE — Telephone Encounter (Signed)
Previously submitted

## 2021-06-03 ENCOUNTER — Telehealth: Payer: Self-pay

## 2021-06-03 NOTE — Telephone Encounter (Signed)
PA required for SynviscOne, bilateral knee. Faxed completed PA form to  [place] at [phone].

## 2021-06-06 ENCOUNTER — Other Ambulatory Visit: Payer: Self-pay

## 2021-06-06 ENCOUNTER — Encounter: Payer: No Typology Code available for payment source | Admitting: Occupational Therapy

## 2021-06-06 MED ORDER — DICLOFENAC SODIUM 2 % EX SOLN
2.0000 g | Freq: Two times a day (BID) | CUTANEOUS | 3 refills | Status: DC | PRN
Start: 2021-06-06 — End: 2021-10-21

## 2021-06-10 ENCOUNTER — Telehealth: Payer: Self-pay

## 2021-06-10 NOTE — Telephone Encounter (Signed)
Submitted for Monovisc, due to [place], not being a preferred product through patient's insurance. PA for Monovisc has been approved.  Currently waiting for VOB to return.

## 2021-06-11 ENCOUNTER — Telehealth: Payer: Self-pay

## 2021-06-11 NOTE — Telephone Encounter (Signed)
Approved for Monovisc, bilateral knee. Buy & Bill Must meet deductible first Patient will be responsible for 20% OOP. No Co-pay PA Approval#/Case# [id number] Valid 06/03/2021- 06/02/2022  Appt. 06/18/2021 with Dr. Erlinda Hong.

## 2021-06-13 ENCOUNTER — Encounter: Payer: No Typology Code available for payment source | Admitting: Occupational Therapy

## 2021-06-17 ENCOUNTER — Other Ambulatory Visit: Payer: Self-pay | Admitting: Medical

## 2021-06-17 NOTE — Telephone Encounter (Signed)
Last cpe 7/22

## 2021-06-18 ENCOUNTER — Other Ambulatory Visit: Payer: Self-pay

## 2021-06-18 ENCOUNTER — Ambulatory Visit (INDEPENDENT_AMBULATORY_CARE_PROVIDER_SITE_OTHER): Payer: No Typology Code available for payment source | Admitting: Orthopaedic Surgery

## 2021-06-18 ENCOUNTER — Encounter: Payer: Self-pay | Admitting: Orthopaedic Surgery

## 2021-06-18 VITALS — Ht 74.0 in | Wt 285.0 lb

## 2021-06-18 DIAGNOSIS — M170 Bilateral primary osteoarthritis of knee: Secondary | ICD-10-CM

## 2021-06-18 NOTE — Progress Notes (Signed)
Office Visit Note   Patient: Tony Waller           Date of Birth: 11/02/1970           MRN: [id number] Visit Date: 06/18/2021              Requested by: Carlena Hurl, PA-C [address] [place],  [place] [address] PCP: Carlena Hurl, PA-C   Assessment & Plan: Visit Diagnoses:  1. Bilateral primary osteoarthritis of knee     Plan: Impression is bilateral knee osteoarthritis.  Today, proceed with bilateral knee Monovisc injections.  He tolerated these well.  Follow-up with [place] as needed.  Follow-Up Instructions: Return if symptoms worsen or fail to improve.   Orders:  No orders of the defined types were placed in this encounter.  No orders of the defined types were placed in this encounter.     Procedures: Large Joint Inj: bilateral knee on 06/18/2021 10:46 AM Indications: pain Details: 22 G needle, anterolateral approach Medications (Right): 0.66 mL bupivacaine 0.25 %; 3 mL lidocaine 1 %; 88 mg Hyaluronan 88 MG/4ML Medications (Left): 0.66 mL bupivacaine 0.25 %; 3 mL lidocaine 1 %; 88 mg Hyaluronan 88 MG/4ML     Clinical Data: No additional findings.   Subjective: Chief Complaint  Patient presents with   Left Knee - Follow-up    Monovisc   Right Knee - Follow-up    Monovisc    HPI patient is a very pleasant 50 year old gentleman with underlying bilateral knee osteoarthritis who comes in today for bilateral knee Monovisc injections.  He has had these in the past which provide a few months relief.     Objective: Vital Signs: Ht 6' 2"  (1.88 m)   Wt 285 lb (129.3 kg)   BMI 36.59 kg/m     Ortho Exam stable bilateral knee exam  Specialty Comments:  No specialty comments available.  Imaging: No new imaging   PMFS History: Patient Active Problem List   Diagnosis Date Noted   Impaired fasting blood sugar 02/20/2021   Encounter for health maintenance examination in adult 02/15/2021   Vitamin deficiency 02/15/2021   High risk medication use  02/15/2021   Elevated blood-pressure reading without diagnosis of hypertension 02/15/2021   Prostate enlargement 02/15/2021   Screening for heart disease 02/15/2021   Screening for diabetes mellitus 02/15/2021   Screening for prostate cancer 02/15/2021   Screening for thyroid disorder 02/15/2021   Vaccine counseling 02/15/2021   BMI 36.0-36.9,adult 11/01/2020   Osteoarthritis of both knees 11/01/2020   OSA (obstructive sleep apnea) 11/01/2020   Screen for colon cancer 11/01/2020   Arthrosis of left acromioclavicular joint 08/21/2020   Acute medial meniscus tear of left knee 04/13/2019   Stress fracture of left tibia 04/13/2019   ALLERGIC RHINITIS 05/28/2009   Past Medical History:  Diagnosis Date   ALLERGIC RHINITIS    Arthralgia    knees, shoulder   Arthritis    OSA (obstructive sleep apnea) 2011   on CPAP most nights   Wears contact lenses     Family History  Problem Relation Age of Onset   Allergies Mother    Diabetes Mother    Arthritis Mother    Lung cancer Paternal Grandfather    Cancer Paternal Grandfather    Brain cancer Maternal Uncle    Hyperlipidemia Father    Hypertension Father    Arthritis Brother    Stroke Maternal Grandmother    Heart disease Maternal Grandfather    Heart disease  Paternal Grandmother     Past Surgical History:  Procedure Laterality Date   BUNIONECTOMY Left    KNEE ARTHROSCOPY WITH SUBCHONDROPLASTY Left 06/22/2019   Procedure: LEFT KNEE ARTHROSCOPY WITH PARTIAL MEDIAL MENISCECTOMY, SUBCHONDROPLASTY AND EXCISION OF CYST;  Surgeon: Leandrew Koyanagi, MD;  Location: [place];  Service: Orthopedics;  Laterality: Left;   NASAL SINUS SURGERY  2000   for deviated septum and turbonate reduction   ROTATOR CUFF REPAIR     right   TONSILLECTOMY  1994   Social History   Occupational History   Occupation: Scientist, product/process development: Theme park manager  Tobacco Use   Smoking status: Former    Types: Cigars   Smokeless  tobacco: Never  Vaping Use   Vaping Use: Never used  Substance and Sexual Activity   Alcohol use: Yes    Alcohol/week: 4.0 standard drinks    Types: 4 Standard drinks or equivalent per week    Comment: occas   Drug use: Never   Sexual activity: Not on file

## 2021-06-19 MED ORDER — HYALURONAN 88 MG/4ML IX SOSY
88.0000 mg | PREFILLED_SYRINGE | INTRA_ARTICULAR | Status: AC | PRN
Start: 2021-06-18 — End: 2021-06-18
  Administered 2021-06-18: 88 mg via INTRA_ARTICULAR

## 2021-06-19 MED ORDER — LIDOCAINE HCL 1 % IJ SOLN
3.0000 mL | INTRAMUSCULAR | Status: AC | PRN
Start: 2021-06-18 — End: 2021-06-18
  Administered 2021-06-18: 3 mL

## 2021-06-19 MED ORDER — HYALURONAN 88 MG/4ML IX SOSY
88.0000 mg | PREFILLED_SYRINGE | INTRA_ARTICULAR | Status: AC | PRN
  Administered 2021-06-18: 88 mg via INTRA_ARTICULAR

## 2021-06-19 MED ORDER — BUPIVACAINE HCL 0.25 % IJ SOLN
0.6600 mL | INTRAMUSCULAR | Status: AC | PRN
  Administered 2021-06-18: .66 mL via INTRA_ARTICULAR

## 2021-06-19 MED ORDER — LIDOCAINE HCL 1 % IJ SOLN
3.0000 mL | INTRAMUSCULAR | Status: AC | PRN
  Administered 2021-06-18: 3 mL

## 2021-08-06 ENCOUNTER — Other Ambulatory Visit: Payer: Self-pay | Admitting: Physician Assistant

## 2021-08-16 ENCOUNTER — Other Ambulatory Visit: Payer: Self-pay | Admitting: Medical

## 2021-09-25 ENCOUNTER — Other Ambulatory Visit: Payer: Self-pay | Admitting: Medical

## 2021-10-21 ENCOUNTER — Other Ambulatory Visit: Payer: Self-pay

## 2021-10-21 ENCOUNTER — Ambulatory Visit (INDEPENDENT_AMBULATORY_CARE_PROVIDER_SITE_OTHER): Payer: No Typology Code available for payment source | Admitting: Medical

## 2021-10-21 VITALS — BP 160/90 | HR 72 | Wt 291.6 lb

## 2021-10-21 DIAGNOSIS — F419 Anxiety disorder, unspecified: Secondary | ICD-10-CM

## 2021-10-21 DIAGNOSIS — R4586 Emotional lability: Secondary | ICD-10-CM

## 2021-10-21 MED ORDER — BUPROPION HCL ER (XL) 150 MG PO TB24: 150.0000 mg | ORAL_TABLET | Freq: Every day | ORAL | 1 refills | Status: DC

## 2021-10-21 NOTE — Progress Notes (Signed)
Subjective: ? Tony Waller is a 51 y.o. male who presents for ?Chief Complaint  ?Patient presents with  ? aniexty and depression  ?  Anxiety and depression for several months but getting worse. Ph9 and GAD7 is abnormal  ?   ?Here for concerns of anxiety and depression.  In recent months he has felt more anxious, felt down his mood at times, he is more irritable at home, sleep has been difficult at times.  Feels scattered with trouble focusing at times.    Has felt this way in the past but not as bad enough to affect work as it is now.  Zones out at times.  At times can feel angry. ? ?He feels like he has no downtime for himself. ? ?He works as a Veterinary surgeon at [employer].  He works full-time 40 to 50 hours/week.  His wife works full-time but she is also in school.  She does have to go in person school which is in [place].  So every Saturday she is gone pretty much all day which leads him taking care of the household a lot of the time.  She will be finished with school in December with a masters in social work.   ? ?He started therapy this past week.  Has had 1 session.  He plans to see them once a week for now.  I recommended he come in and talk to PCP about possible medication ? ?Between parenting, work stress, life being busy in general feels like he has no downtime.  Has been having trouble finding time for hobbies and exercise.   ? ?Did ok in middle school and high school, was always quick to pickup on things.  In college it was harder, struggled a bit, but used time management skills to get things done.   ? ?Usually excels at work, but struggling currently.   Been in current position over 3 years. ? ?Has 3 children, 72yo, 32yo daughter lives at home with him along with her son (Tony Waller grandson), and 10yo son.   ? ?Work hours 40-50.  On calls most of the day, travels every now and then. ? ?Parents live close, but they feel like they cannot burden him with their issues ? ?No prior eval or  treatment for mental health issues ? ?No other aggravating or relieving factors.   ? ?No other c/o. ? ?The following portions of the patient's history were reviewed and updated as appropriate: allergies, current medications, past family history, past medical history, past social history, past surgical history and problem list. ? ?ROS ?Otherwise as in subjective above ? ?Objective: ?BP (!) 160/90   Pulse 72   Wt 291 lb 9.6 oz (132.3 kg)   BMI 37.44 kg/m?  ? ?General appearance: alert, no distress, well developed, well nourished ?Psych: pleasant, good eye contact, answers questions appropriately ? ? ?Assessment: ?Encounter Diagnoses  ?Name Primary?  ? Anxiety Yes  ? Mood change   ? ? ? ?Plan: ?We discussed his concerns.  We discussed strategies to find time for personal time.  We discussed the importance of prioritizing sleep, making sure there is time for exercise, having daytime with wife periodically, and just looking at the overall schedule to find ways to make This happen.  We discussed combining exercise with family time such as playing out in the yard with kids.  We discussed the need to continue with counseling for now.  Have boundaries at work.  We discussed medication options.  Begin trial of Wellbutrin.  We discussed other SSRIs as well.  Discussed risk and benefits and proper use of medication ? ?[name] was seen today for aniexty and depression. ? ?Diagnoses and all orders for this visit: ? ?Anxiety ? ?Mood change ? ?Other orders ?-     buPROPion (WELLBUTRIN XL) 150 MG 24 hr tablet; Take 1 tablet (150 mg total) by mouth daily. ? ? ? ?Follow up: 3 to 4 weeks ?

## 2021-11-12 ENCOUNTER — Other Ambulatory Visit: Payer: Self-pay | Admitting: Medical

## 2021-11-12 NOTE — Telephone Encounter (Signed)
Pt has an appt for tomorrow.  ?

## 2021-11-13 ENCOUNTER — Telehealth (INDEPENDENT_AMBULATORY_CARE_PROVIDER_SITE_OTHER): Payer: No Typology Code available for payment source | Admitting: Medical

## 2021-11-13 VITALS — Wt 287.0 lb

## 2021-11-13 DIAGNOSIS — R7301 Impaired fasting glucose: Secondary | ICD-10-CM | POA: Diagnosis not present

## 2021-11-13 DIAGNOSIS — R030 Elevated blood-pressure reading, without diagnosis of hypertension: Secondary | ICD-10-CM | POA: Diagnosis not present

## 2021-11-13 DIAGNOSIS — Z6836 Body mass index (BMI) 36.0-36.9, adult: Secondary | ICD-10-CM

## 2021-11-13 DIAGNOSIS — F419 Anxiety disorder, unspecified: Secondary | ICD-10-CM

## 2021-11-13 MED ORDER — WEGOVY 0.25 MG/0.5ML ~~LOC~~ SOAJ: 0.2500 mg | SUBCUTANEOUS | 0 refills | Status: DC

## 2021-11-13 MED ORDER — VITAMIN D (ERGOCALCIFEROL) 1.25 MG (50000 UNIT) PO CAPS: 50000.0000 [IU] | ORAL_CAPSULE | ORAL | 3 refills | Status: DC

## 2021-11-13 MED ORDER — BUPROPION HCL ER (XL) 300 MG PO TB24: 300.0000 mg | ORAL_TABLET | Freq: Every day | ORAL | 2 refills | Status: DC

## 2021-11-13 NOTE — Progress Notes (Signed)
This visit type was conducted due to national recommendations for restrictions regarding the COVID-19 Pandemic (e.g. social distancing) in an effort to limit this patient's exposure and mitigate transmission in our community.  Due to their co-morbid illnesses, this patient is at least at moderate risk for complications without adequate follow up.  This format is felt to be most appropriate for this patient at this time.   ? ?Documentation for virtual audio and video telecommunications through [place] encounter: ? ?The patient was located at home. ?The provider was located in the office. ?The patient did consent to this visit and is aware of possible charges through their insurance for this visit. ? ?The other persons participating in this telemedicine service were none. ?Time spent on call was 26 minutes and in review of previous records >30 minutes total. ? ?This virtual service is not related to other E/M service within previous 7 days. ? ? ? ?Subjective: ? Tony Waller is a 51 y.o. male who presents for ?Chief Complaint  ?Patient presents with  ? Follow-up  ?  Follow-up on Anxiety and needs medication refilled  ?   ?Virtual consult to follow-up on anxiety.  About a month ago we started Wellbutrin.  No side effects but not quite as strong as it should be he thinks.  It does seem to be helping some though.  He is seeing a Social worker.  Has been working on things we discussed last time.  He is trying to make time for himself.  He has started back with a personal trainer for exercise.  He and wife had a date weekend this past weekend.  That really helped.  He is working on Personal assistant schedule and setting boundaries. ? ?He would like to go on medication help with weight loss.  Last summer we tried to get Qsymia approved.  He ended up taking it for 2 months but it really did not seem to help.  He is trying to eat healthy.  He is motivated to lose weight. ? ?No other aggravating or relieving factors.   ? ?No other  c/o. ? ? ?Past Medical History:  ?Diagnosis Date  ? ALLERGIC RHINITIS   ? Arthralgia   ? knees, shoulder  ? Arthritis   ? OSA (obstructive sleep apnea) 2011  ? on CPAP most nights  ? Wears contact lenses   ? ?Current Outpatient Medications on File Prior to Visit  ?Medication Sig Dispense Refill  ? acyclovir (ZOVIRAX) 400 MG tablet TAKE 1 TABLET 2 TIMES DAILY FOR PREVENTION. CAN USE 2 TABLETS 3X A DAY X 2 DAYS FOR FLARE UP 180 tablet 0  ? ?No current facility-administered medications on file prior to visit.  ? ? ? ?The following portions of the patient's history were reviewed and updated as appropriate: allergies, current medications, past family history, past medical history, past social history, past surgical history and problem list. ? ?ROS ?Otherwise as in subjective above ? ? ? ?Objective: ?Wt 287 lb (130.2 kg)   BMI 36.85 kg/m?  ? ?General appearance: alert, no distress, well developed, well nourished ?Psych: pleasant, good eye contact, answers questions appropriately ? ? ?Assessment: ?Encounter Diagnoses  ?Name Primary?  ? Anxiety Yes  ? BMI 36.0-36.9,adult   ? Elevated blood-pressure reading without diagnosis of hypertension   ? Impaired fasting blood sugar   ? ? ? ? ?Plan: ?Anxiety - glad to hear he is seeing counseling.  Increase to Wellbutrin Xl 344m daily.  Continue working on getting personal time in schedule, prioritizing  sleep, making sure there is time for exercise,continue with counseling.  Have boundaries at work.   ? ?Obesity - begin trial of wegovy. Discussed risks, benefits of medicaiton, proper use of medication.   Continue efforts with healthy diet and exercise.  F/u 4-6 weeks.   ? ?Elevated BP - return in 4-6 weeks for recheck ? ?Impaired glucose - return in 4- 6 weeks ? ? ?Tony Waller was seen today for follow-up. ? ?Diagnoses and all orders for this visit: ? ?Anxiety ? ?BMI 36.0-36.9,adult ? ?Elevated blood-pressure reading without diagnosis of hypertension ? ?Impaired fasting blood  sugar ? ?Other orders ?-     buPROPion (WELLBUTRIN XL) 300 MG 24 hr tablet; Take 1 tablet (300 mg total) by mouth daily. ?-     Vitamin D, Ergocalciferol, (DRISDOL) 1.25 MG (50000 UNIT) CAPS capsule; Take 1 capsule (50,000 Units total) by mouth every 7 (seven) days. ?-     Semaglutide-Weight Management (WEGOVY) 0.25 MG/0.5ML SOAJ; Inject 0.25 mg into the skin once a week. ? ? ?Follow up: 4-6 weeks ? ? ? ? ?

## 2021-11-19 ENCOUNTER — Telehealth: Payer: Self-pay

## 2021-11-19 NOTE — Telephone Encounter (Signed)
P.A. WEGOVY approved til 06/16/22, Gabriel Cirri informed pt

## 2021-12-10 ENCOUNTER — Other Ambulatory Visit: Payer: Self-pay | Admitting: Medical

## 2021-12-12 ENCOUNTER — Ambulatory Visit (INDEPENDENT_AMBULATORY_CARE_PROVIDER_SITE_OTHER): Payer: No Typology Code available for payment source | Admitting: Orthopaedic Surgery

## 2021-12-12 ENCOUNTER — Encounter: Payer: Self-pay | Admitting: Orthopaedic Surgery

## 2021-12-12 ENCOUNTER — Telehealth: Payer: Self-pay

## 2021-12-12 VITALS — Ht 73.5 in | Wt 283.0 lb

## 2021-12-12 DIAGNOSIS — M170 Bilateral primary osteoarthritis of knee: Secondary | ICD-10-CM

## 2021-12-12 MED ORDER — LIDOCAINE HCL 1 % IJ SOLN
3.0000 mL | INTRAMUSCULAR | Status: AC | PRN
  Administered 2021-12-12: 3 mL

## 2021-12-12 MED ORDER — BUPIVACAINE HCL 0.25 % IJ SOLN
0.6600 mL | INTRAMUSCULAR | Status: AC | PRN
  Administered 2021-12-12: .66 mL via INTRA_ARTICULAR

## 2021-12-12 MED ORDER — METHYLPREDNISOLONE ACETATE 40 MG/ML IJ SUSP
13.3300 mg | INTRAMUSCULAR | Status: AC | PRN
  Administered 2021-12-12: 13.33 mg via INTRA_ARTICULAR

## 2021-12-12 MED ORDER — IBUPROFEN 600 MG PO TABS: 600.0000 mg | ORAL_TABLET | Freq: Three times a day (TID) | ORAL | 2 refills | Status: DC | PRN

## 2021-12-12 NOTE — Telephone Encounter (Signed)
Please precert for bilateral visco. Dr.Xu's patient. ?Thanks! ? ?

## 2021-12-12 NOTE — Progress Notes (Signed)
? ?Office Visit Note ?  ?Patient: Tony Waller           ?Date of Birth: [birth year]           ?MRN: [id number] ?Visit Date: 12/12/2021 ?             ?Requested by: Carlena Hurl, PA-C ?[address] ?[place],  [place] [address] ?PCP: Carlena Hurl, PA-C ? ? ?Assessment & Plan: ?Visit Diagnoses:  ?1. Bilateral primary osteoarthritis of knee   ? ? ?Plan: Impression is advanced degenerative joint disease both knees left more symptomatic than right.  Today, we proceeded with bilateral knee cortisone injections.  Patient would also like to get preapproval for bilateral knee viscosupplementation injections which were submitted today.  Follow-up with [place] once approved.  We have discussed that this will need to be at least 6 months from his previous injections.  He understands and agrees.  Call with concerns or questions in meantime. ? ?Follow-Up Instructions: Return if symptoms worsen or fail to improve.  ? ?Orders:  ?Orders Placed This Encounter  ?Procedures  ? Large Joint Inj: bilateral knee  ? ?No orders of the defined types were placed in this encounter. ? ? ? ? Procedures: ?Large Joint Inj: bilateral knee on 12/12/2021 9:32 AM ?Indications: pain ?Details: 22 G needle, anterolateral approach ?Medications (Right): 0.66 mL bupivacaine 0.25 %; 3 mL lidocaine 1 %; 13.33 mg methylPREDNISolone acetate 40 MG/ML ?Medications (Left): 0.66 mL bupivacaine 0.25 %; 3 mL lidocaine 1 %; 13.33 mg methylPREDNISolone acetate 40 MG/ML ? ? ? ? ?Clinical Data: ?No additional findings. ? ? ?Subjective: ?Chief Complaint  ?Patient presents with  ? Left Knee - Pain  ? Right Knee - Pain  ? ? ?HPI patient is a pleasant 51 year old gentleman who comes in today with recurrent bilateral knee pain.  History of underlying advanced degenerative joint disease of both knees.  Has previously undergone cortisone as well as viscosupplementation injections.  Both seem to help for about 4 to 5 months each.  He is here today with recurrent pain left greater  than right.  Pain is worse with overexertion as well as with squatting.  He has been taking ibuprofen without significant relief. ? ?Review of Systems as detailed in HPI.  All others reviewed and are negative. ? ? ?Objective: ?Vital Signs: Ht 6' 1.5" (1.867 m)   Wt 283 lb (128.4 kg)   BMI 36.83 kg/m?  ? ?Physical Exam well-developed well-nourished gentleman in no acute distress.  Alert and oriented x3. ? ?Ortho Exam bilateral knee exam shows no effusion.  Range of motion 0 to 120 degrees.  Minimal medial lateral joint line tenderness.  Moderate patellofemoral crepitus.  Ligaments are stable.  He is neurovascular tact distally. ? ?Specialty Comments:  ?No specialty comments available. ? ?Imaging: ?No new imaging ? ? ?PMFS History: ?Patient Active Problem List  ? Diagnosis Date Noted  ? Anxiety 11/13/2021  ? Impaired fasting blood sugar 02/20/2021  ? Encounter for health maintenance examination in adult 02/15/2021  ? Vitamin deficiency 02/15/2021  ? High risk medication use 02/15/2021  ? Elevated blood-pressure reading without diagnosis of hypertension 02/15/2021  ? Prostate enlargement 02/15/2021  ? Screening for heart disease 02/15/2021  ? Screening for diabetes mellitus 02/15/2021  ? Screening for prostate cancer 02/15/2021  ? Screening for thyroid disorder 02/15/2021  ? Vaccine counseling 02/15/2021  ? BMI 36.0-36.9,adult 11/01/2020  ? Osteoarthritis of both knees 11/01/2020  ? OSA (obstructive sleep apnea) 11/01/2020  ? Screen for colon  cancer 11/01/2020  ? Arthrosis of left acromioclavicular joint 08/21/2020  ? Acute medial meniscus tear of left knee 04/13/2019  ? Stress fracture of left tibia 04/13/2019  ? ALLERGIC RHINITIS 05/28/2009  ? ?Past Medical History:  ?Diagnosis Date  ? ALLERGIC RHINITIS   ? Arthralgia   ? knees, shoulder  ? Arthritis   ? OSA (obstructive sleep apnea) 2011  ? on CPAP most nights  ? Wears contact lenses   ?  ?Family History  ?Problem Relation Age of Onset  ? Allergies Mother   ?  Diabetes Mother   ? Arthritis Mother   ? Lung cancer Paternal Grandfather   ? Cancer Paternal Grandfather   ? Brain cancer Maternal Uncle   ? Hyperlipidemia Father   ? Hypertension Father   ? Arthritis Brother   ? Stroke Maternal Grandmother   ? Heart disease Maternal Grandfather   ? Heart disease Paternal Grandmother   ?  ?Past Surgical History:  ?Procedure Laterality Date  ? BUNIONECTOMY Left   ? KNEE ARTHROSCOPY WITH SUBCHONDROPLASTY Left 06/22/2019  ? Procedure: LEFT KNEE ARTHROSCOPY WITH PARTIAL MEDIAL MENISCECTOMY, SUBCHONDROPLASTY AND EXCISION OF CYST;  Surgeon: Leandrew Koyanagi, MD;  Location: [facility];  Service: Orthopedics;  Laterality: Left;  ? NASAL SINUS SURGERY  2000  ? for deviated septum and turbonate reduction  ? ROTATOR CUFF REPAIR    ? right  ? TONSILLECTOMY  1994  ? ?Social History  ? ?Occupational History  ? Occupation: Librarian, academic  ?  Employer: [employer]  ?Tobacco Use  ? Smoking status: Former  ?  Types: Cigars  ? Smokeless tobacco: Never  ?Vaping Use  ? Vaping Use: Never used  ?Substance and Sexual Activity  ? Alcohol use: Yes  ?  Alcohol/week: 4.0 standard drinks  ?  Types: 4 Standard drinks or equivalent per week  ?  Comment: occas  ? Drug use: Never  ? Sexual activity: Not on file  ? ? ? ? ? ? ?

## 2021-12-18 ENCOUNTER — Other Ambulatory Visit: Payer: Self-pay | Admitting: Medical

## 2021-12-18 MED ORDER — WEGOVY 0.5 MG/0.5ML ~~LOC~~ SOAJ: 0.5000 mg | SUBCUTANEOUS | 0 refills | Status: DC

## 2021-12-18 NOTE — Telephone Encounter (Signed)
Noted  

## 2021-12-31 ENCOUNTER — Encounter: Payer: Self-pay | Admitting: Orthopaedic Surgery

## 2021-12-31 ENCOUNTER — Telehealth: Payer: Self-pay

## 2021-12-31 NOTE — Telephone Encounter (Signed)
Talked with patient concerning gel injection.   Appt.scheduled.  VOB has been submitted for Monovisc, bilateral knee.

## 2022-01-01 ENCOUNTER — Other Ambulatory Visit: Payer: Self-pay

## 2022-01-01 DIAGNOSIS — M170 Bilateral primary osteoarthritis of knee: Secondary | ICD-10-CM

## 2022-01-02 ENCOUNTER — Other Ambulatory Visit: Payer: Self-pay | Admitting: Medical

## 2022-01-08 ENCOUNTER — Ambulatory Visit (INDEPENDENT_AMBULATORY_CARE_PROVIDER_SITE_OTHER): Payer: No Typology Code available for payment source | Admitting: Orthopaedic Surgery

## 2022-01-08 DIAGNOSIS — M170 Bilateral primary osteoarthritis of knee: Secondary | ICD-10-CM

## 2022-01-08 MED ORDER — HYALURONAN 88 MG/4ML IX SOSY
88.0000 mg | PREFILLED_SYRINGE | INTRA_ARTICULAR | Status: AC | PRN
  Administered 2022-01-08: 88 mg via INTRA_ARTICULAR

## 2022-01-08 MED ORDER — BUPIVACAINE HCL 0.25 % IJ SOLN
0.6600 mL | INTRAMUSCULAR | Status: AC | PRN
  Administered 2022-01-08: .66 mL via INTRA_ARTICULAR

## 2022-01-08 MED ORDER — LIDOCAINE HCL 1 % IJ SOLN
3.0000 mL | INTRAMUSCULAR | Status: AC | PRN
  Administered 2022-01-08: 3 mL

## 2022-01-08 NOTE — Progress Notes (Signed)
Office Visit Note   Patient: Tony Waller           Date of Birth: Oct 10, 1970           MRN: [id number] Visit Date: 01/08/2022              Requested by: Carlena Hurl, PA-C [address] [place],  [place] [address] PCP: Carlena Hurl, PA-C   Assessment & Plan: Visit Diagnoses:  1. Bilateral primary osteoarthritis of knee     Plan: Impression is bilateral knee osteoarthritis.  Today we proceeded with bilateral knee Monovisc injections.  He tolerated these well.  He will follow-up with [place] as needed.  Follow-Up Instructions: Return if symptoms worsen or fail to improve.   Orders:  Orders Placed This Encounter  Procedures   Large Joint Inj: bilateral knee   No orders of the defined types were placed in this encounter.     Procedures: Large Joint Inj: bilateral knee on 01/08/2022 10:24 AM Indications: pain Details: 22 G needle, anterolateral approach Medications (Right): 0.66 mL bupivacaine 0.25 %; 3 mL lidocaine 1 %; 88 mg Hyaluronan 88 MG/4ML Medications (Left): 0.66 mL bupivacaine 0.25 %; 3 mL lidocaine 1 %; 88 mg Hyaluronan 88 MG/4ML     Clinical Data: No additional findings.   Subjective: No chief complaint on file.   HPI patient is a pleasant 51 year old gentleman with underlying bilateral knee osteoarthritis comes in today for repeat bilateral knee Monovisc injections.  He has had these in the past with great relief.  He notes the usually gets about 5 months relief in the left knee in about 6 months in the right knee.     Objective: Vital Signs: There were no vitals taken for this visit.    Ortho Exam stable bilateral knee exam  Specialty Comments:  No specialty comments available.  Imaging: No new imaging   PMFS History: Patient Active Problem List   Diagnosis Date Noted   Anxiety 11/13/2021   Impaired fasting blood sugar 02/20/2021   Encounter for health maintenance examination in adult 02/15/2021   Vitamin deficiency 02/15/2021    High risk medication use 02/15/2021   Elevated blood-pressure reading without diagnosis of hypertension 02/15/2021   Prostate enlargement 02/15/2021   Screening for heart disease 02/15/2021   Screening for diabetes mellitus 02/15/2021   Screening for prostate cancer 02/15/2021   Screening for thyroid disorder 02/15/2021   Vaccine counseling 02/15/2021   BMI 36.0-36.9,adult 11/01/2020   Osteoarthritis of both knees 11/01/2020   OSA (obstructive sleep apnea) 11/01/2020   Screen for colon cancer 11/01/2020   Arthrosis of left acromioclavicular joint 08/21/2020   Acute medial meniscus tear of left knee 04/13/2019   Stress fracture of left tibia 04/13/2019   ALLERGIC RHINITIS 05/28/2009   Past Medical History:  Diagnosis Date   ALLERGIC RHINITIS    Arthralgia    knees, shoulder   Arthritis    OSA (obstructive sleep apnea) 2011   on CPAP most nights   Wears contact lenses     Family History  Problem Relation Age of Onset   Allergies Mother    Diabetes Mother    Arthritis Mother    Lung cancer Paternal Grandfather    Cancer Paternal Grandfather    Brain cancer Maternal Uncle    Hyperlipidemia Father    Hypertension Father    Arthritis Brother    Stroke Maternal Grandmother    Heart disease Maternal Grandfather    Heart disease Paternal Grandmother  Past Surgical History:  Procedure Laterality Date   BUNIONECTOMY Left    KNEE ARTHROSCOPY WITH SUBCHONDROPLASTY Left 06/22/2019   Procedure: LEFT KNEE ARTHROSCOPY WITH PARTIAL MEDIAL MENISCECTOMY, SUBCHONDROPLASTY AND EXCISION OF CYST;  Surgeon: Leandrew Koyanagi, MD;  Location: [facility];  Service: Orthopedics;  Laterality: Left;   NASAL SINUS SURGERY  2000   for deviated septum and turbonate reduction   ROTATOR CUFF REPAIR     right   TONSILLECTOMY  1994   Social History   Occupational History   Occupation: Scientist, product/process development: [employer]  Tobacco Use   Smoking status: Former    Types:  Cigars   Smokeless tobacco: Never  Vaping Use   Vaping Use: Never used  Substance and Sexual Activity   Alcohol use: Yes    Alcohol/week: 4.0 standard drinks    Types: 4 Standard drinks or equivalent per week    Comment: occas   Drug use: Never   Sexual activity: Not on file

## 2022-01-10 ENCOUNTER — Telehealth: Payer: Self-pay

## 2022-01-10 NOTE — Telephone Encounter (Signed)
Pt states he hasn't been able to get his Wegovy,  [facility] and Mancel Parsons is back ordered and they do not know when they will have it back in, no dosage is available.  I also called [facility] and they are also out.  Do you want to switch pt to another medication or just have him wait?

## 2022-01-14 NOTE — Telephone Encounter (Signed)
Spoke with Rep and he said pretty much no where has any dosages right now and should know more by the end of week, he doesn't think it will be a long time  (not Sept which was a rumor a patient was told) but he will follow back up with the office within the week to let us know an update

## 2022-01-15 ENCOUNTER — Other Ambulatory Visit: Payer: Self-pay | Admitting: Medical

## 2022-01-15 MED ORDER — WEGOVY 0.5 MG/0.5ML ~~LOC~~ SOAJ: 0.5000 mg | SUBCUTANEOUS | 0 refills | Status: DC

## 2022-01-15 NOTE — Telephone Encounter (Signed)
Called pt and informed that [facility] order pharmacy has Wegovy in stock and he will call his [place] and see if he can switch there and call back.

## 2022-01-21 ENCOUNTER — Other Ambulatory Visit: Payer: Self-pay | Admitting: Medical

## 2022-01-21 ENCOUNTER — Telehealth: Payer: Self-pay

## 2022-01-21 NOTE — Telephone Encounter (Signed)
Pt has been unable to find any Wegovy,  he wants to see if you will call in Phentermine until he is able to get back on the [facility].  He said he took it months ago and he did fine on it and he helps some and this will only be until the national shortage of Mancel Parsons is over.

## 2022-01-24 ENCOUNTER — Other Ambulatory Visit: Payer: Self-pay | Admitting: Medical

## 2022-01-24 MED ORDER — QSYMIA 7.5-46 MG PO CP24
1.0000 | ORAL_CAPSULE | ORAL | 1 refills | Status: DC
Start: 2022-01-24 — End: 2022-05-05

## 2022-01-24 NOTE — Telephone Encounter (Signed)
Called pt and explained Tony Waller's note and he is ok with Qsymia, he said he thought he had taken it before.  [facility] (may require a P.A. not sure yet)

## 2022-01-24 NOTE — Telephone Encounter (Signed)
See other note

## 2022-01-31 NOTE — Telephone Encounter (Signed)
P.A. QSYMIA done and approved, pt informed

## 2022-02-14 ENCOUNTER — Telehealth: Payer: Self-pay

## 2022-02-14 NOTE — Telephone Encounter (Signed)
Pt. Called about needing another PA on his Wegovy because he recently switched his ins. And pt. Is emailing new ins. Card. If you could look into that for him and get started on PA for his [facility].

## 2022-02-14 NOTE — Telephone Encounter (Signed)
Started PA  for [facility] and medication is not covered under his plan. Please advise

## 2022-02-17 NOTE — Telephone Encounter (Signed)
Pt called back and would like [place] to appeal his denial for wegovy. A letter needs to be sent to [facility] Department At Fax number [phone]. Weight loss medications are not covered under patients plan

## 2022-02-17 NOTE — Telephone Encounter (Signed)
Pt was notified. Pt new insurance plan does not cover weight loss or DM if he does not have diabetes. He will work on fitness and calorie counting

## 2022-02-24 ENCOUNTER — Ambulatory Visit (INDEPENDENT_AMBULATORY_CARE_PROVIDER_SITE_OTHER): Payer: BC Managed Care – PPO | Admitting: Medical

## 2022-02-24 VITALS — BP 150/90 | HR 82 | Wt 287.2 lb

## 2022-02-24 DIAGNOSIS — Z6837 Body mass index (BMI) 37.0-37.9, adult: Secondary | ICD-10-CM | POA: Insufficient documentation

## 2022-02-24 DIAGNOSIS — I10 Essential (primary) hypertension: Secondary | ICD-10-CM | POA: Diagnosis not present

## 2022-02-24 DIAGNOSIS — G4733 Obstructive sleep apnea (adult) (pediatric): Secondary | ICD-10-CM | POA: Diagnosis not present

## 2022-02-24 DIAGNOSIS — R7301 Impaired fasting glucose: Secondary | ICD-10-CM

## 2022-02-24 MED ORDER — EDARBI 40 MG PO TABS: 1.0000 | ORAL_TABLET | Freq: Every day | ORAL | 0 refills | Status: DC

## 2022-02-24 MED ORDER — QSYMIA 11.25-69 MG PO CP24: 1.0000 | ORAL_CAPSULE | Freq: Every day | ORAL | 0 refills | Status: DC

## 2022-02-24 NOTE — Progress Notes (Signed)
Subjective:  Gaege Sangalang is a 51 y.o. male who presents for Chief Complaint  Patient presents with   blood pressure    Needs bp check again before prescribing meds     Here for follow-up on concerns.  After his last visit he went to continue [facility] to help with weight loss.  He initially did a trial of 1 month and saw improvement but insurance would not pay for this.  Apparently insurance would not pay for Qsymia either.  All of this worked with his insurance on several prior Auth attempts but apparently they would not cover these medications.  He was able to get 1 month of Wegovy, lost down to 278lb.   He wanted me to prescribe phentermine but I was concerned about multiple prior elevated blood pressure readings.  He notes being on Qsymia in the past without much improvement.  No side effectsd reported with qsymia.  Had no side effects with the month of wegovy.   Current exercise - meeting with trainer 3 days per week, and just signed up for membership at [facility], trying to do some swimming.  Swam this morning.  Current eating habits Fruit or scrambled eggs for breakfast.  [place] sandwich or salad for lunch, dinner typically chicken, some beef.  Just downloaded my fitness pal to tack calories.   He has a history of sleep apnea, impaired fasting glucose.  He is non consistent with CPAP.  No prior medication for BP.   Not checking BPs.    No other aggravating or relieving factors.    No other c/o.  Past Medical History:  Diagnosis Date   ALLERGIC RHINITIS    Arthralgia    knees, shoulder   Arthritis    OSA (obstructive sleep apnea) 2011   on CPAP most nights   Wears contact lenses    Current Outpatient Medications on File Prior to Visit  Medication Sig Dispense Refill   acyclovir (ZOVIRAX) 400 MG tablet TAKE 1 TABLET 2 TIMES DAILY FOR PREVENTION. CAN USE 2 TABLETS 3X A DAY X 2 DAYS FOR FLARE UP 180 tablet 0   buPROPion (WELLBUTRIN XL) 300 MG 24 hr tablet TAKE 1 TABLET BY MOUTH  EVERY DAY 90 tablet 0   ibuprofen (ADVIL) 600 MG tablet Take 1 tablet (600 mg total) by mouth every 8 (eight) hours as needed. 60 tablet 2   Phentermine-Topiramate (QSYMIA) 7.5-46 MG CP24 Take 1 capsule by mouth every morning. 30 capsule 1   Vitamin D, Ergocalciferol, (DRISDOL) 1.25 MG (50000 UNIT) CAPS capsule Take 1 capsule (50,000 Units total) by mouth every 7 (seven) days. 12 capsule 3   Semaglutide-Weight Management (WEGOVY) 0.5 MG/0.5ML SOAJ Inject 0.5 mg into the skin once a week. (Patient not taking: Reported on 02/24/2022) 2 mL 0   No current facility-administered medications on file prior to visit.    The following portions of the patient's history were reviewed and updated as appropriate: allergies, current medications, past family history, past medical history, past social history, past surgical history and problem list.  ROS Otherwise as in subjective above     Objective: BP (!) 150/90   Pulse 82   Wt 287 lb 3.2 oz (130.3 kg)   BMI 37.38 kg/m   BP Readings from Last 3 Encounters:  02/24/22 (!) 150/90  10/21/21 (!) 160/90  02/15/21 140/82   Wt Readings from Last 3 Encounters:  02/24/22 287 lb 3.2 oz (130.3 kg)  12/12/21 283 lb (128.4 kg)  11/13/21 287 lb (130.2  kg)    General appearance: alert, no distress, well developed, well nourished Pulses: 2+ radial pulses, 2+ pedal pulses, normal cap refill Ext: no edema   Assessment: Encounter Diagnoses  Name Primary?   Essential hypertension, benign Yes   Impaired fasting blood sugar    OSA (obstructive sleep apnea)    BMI 37.0-37.9, adult      Plan: I reviewed his labs from year ago July 2022.  At that time his blood sugar was normal, liver kidney electrolytes are normal, blood counts are normal, cholesterol was elevated with LDL 158, total cholesterol 241.  Hemoglobin A1c was 5.9% a year ago.  Thyroid normal year ago.  We discussed strategies to help with weight loss.  Discussed getting 150+ minutes of  exercise weekly.  Discussed medications on the market to have a weight loss but unfortunately insurance does not cover these.  I have sent a message back to our office person Vernona Rieger that works with prior Auth to appeal the prior denial on Wegovy.  Continue Qsymia 7 mg lower dose for the next week as he starts [place].  After 1 week if no issues with the medicine can go up to the higher dose of Qsymia in the meantime as we try to get [facility] approved.  I would rather him be on Wegovy than Qsymia for weight loss efforts  Hypertension-we discussed new diagnosis of high blood pressure.  We discussed potential complications of high blood pressure.  Begin trial over [place] samples 1 daily.  Discussed risk and benefits of medication.  After 1 month he is supposed to follow-up and get me blood pressure readings.  We may need to switch to valsartan as Raynelle Chary would probably be too expensive or not covered by insurance  We discussed history of sleep apnea he sometimes uses CPAP.  Last sleep study years ago.  For now he does not want to pursue an updated sleep study.  He wants to work on weight loss through lifestyle changes to address his sleep apnea  Vilas was seen today for blood pressure.  Diagnoses and all orders for this visit:  Essential hypertension, benign  Impaired fasting blood sugar  OSA (obstructive sleep apnea)  BMI 37.0-37.9, adult  Other orders -     Phentermine-Topiramate (QSYMIA) 11.25-69 MG CP24; Take 1 capsule by mouth daily. -     Azilsartan Medoxomil (EDARBI) 40 MG TABS; Take 1 tablet by mouth daily.    Follow up: pending [employer] appeal, call report or recheck 61mo

## 2022-02-24 NOTE — Patient Instructions (Signed)
Hypertension (high blood pressure):  Hypertension, commonly called high blood pressure, is when the force of blood pumping through your arteries is too strong. Your arteries are the blood vessels that carry blood from your heart throughout your body. A blood pressure reading consists of a higher number over a lower number, such as 110/72. The higher number (systolic) is the pressure inside your arteries when your heart pumps. The lower number (diastolic) is the pressure inside your arteries when your heart relaxes. Ideally you want your blood pressure below 120/80. Hypertension forces your heart to work harder to pump blood. Your arteries may become narrow or stiff. Having hypertension puts you at risk for heart disease, stroke, and other problems.  RISK FACTORS Some risk factors for high blood pressure are controllable. Others are not.  Risk factors you cannot control include:   Race. You may be at higher risk if you are African American.  Age. Risk increases with age.  Gender. Men are at higher risk than women before age 45 years. After age 65, women are at higher risk than men. Risk factors you can control include:  Not getting enough exercise or physical activity.  Being overweight.  Getting too much fat, sugar, calories, or salt in your diet.  Drinking too much alcohol. SIGNS AND SYMPTOMS Hypertension does not usually cause signs or symptoms. Extremely high blood pressure (hypertensive crisis) may cause headache, anxiety, shortness of breath, and nosebleed. DIAGNOSIS  To check if you have hypertension, your health care provider will measure your blood pressure while you are seated, with your arm held at the level of your heart. It should be measured at least twice using the same arm. Certain conditions can cause a difference in blood pressure between your right and left arms. A blood pressure reading that is higher than normal on one occasion does not mean that you need treatment. If  one blood pressure reading is high, ask your health care provider about having it checked again. BLOOD PRESSURE STAGES Blood pressure is classified into four stages: normal, prehypertension, stage 1, and stage 2. Your blood pressure reading will be used to determine what type of treatment, if any, is necessary. Appropriate treatment options are tied to these four stages:  Normal  Systolic pressure (mm Hg): below 120.  Diastolic pressure (mm Hg): below 80. Prehypertension  Systolic pressure (mm Hg): 120 to 139.  Diastolic pressure (mm Hg): 80 to 89. Stage1  Systolic pressure (mm Hg): 140 to 159.  Diastolic pressure (mm Hg): 90 to 99. Stage2  Systolic pressure (mm Hg): 160 or above.  Diastolic pressure (mm Hg): 100 or above. RISKS RELATED TO HIGH BLOOD PRESSURE Managing your blood pressure is an important responsibility. Uncontrolled high blood pressure can lead to:  A heart attack.  A stroke.  A weakened blood vessel (aneurysm).  Heart failure.  Kidney damage.  Eye damage.  Metabolic syndrome.  Memory and concentration problems. TREATMENT  Treating high blood pressure includes making lifestyle changes and possibly taking medicine. Living a healthy lifestyle can help lower high blood pressure. You may need to change some of your habits. Lifestyle changes may include:  Following the DASH diet. This diet is high in fruits, vegetables, and whole grains. It is low in salt, red meat, and added sugars.  Getting at least 2 hours of brisk physical activity every week.  Losing weight if necessary.  Not smoking.  Limiting alcoholic beverages.  Learning ways to reduce stress. If lifestyle changes are not enough to   get your blood pressure under control, your health care provider may prescribe medicine. You may need to take more than one. Work closely with your health care provider to understand the risks and benefits. HOME CARE INSTRUCTIONS  Have your blood pressure  rechecked as directed by your health care provider.   Take medicines only as directed by your health care provider. Follow the directions carefully. Blood pressure medicines must be taken as prescribed. The medicine does not work as well when you skip doses. Skipping doses also puts you at risk for problems.   Do not smoke.   Monitor your blood pressure at home as directed by your health care provider. SEEK MEDICAL CARE IF:   You think you are having a reaction to medicines taken.  You have recurrent headaches or feel dizzy.  You have swelling in your ankles.  You have trouble with your vision. SEEK IMMEDIATE MEDICAL CARE IF:  You develop a severe headache or confusion.  You have unusual weakness, numbness, or feel faint.  You have severe chest or abdominal pain.  You vomit repeatedly.  You have trouble breathing. MAKE SURE YOU:   Understand these instructions.  Will watch your condition.  Will get help right away if you are not doing well or get worse. Document Released: 07/28/2005 Document Revised: 12/12/2013 Document Reviewed: 05/20/2013 ExitCare Patient Information 2015 [employer]. This information is not intended to replace advice given to you by your health care provider. Make sure you discuss any questions you have with your health care provider.     

## 2022-02-25 ENCOUNTER — Encounter: Payer: Self-pay | Admitting: Medical

## 2022-03-01 ENCOUNTER — Telehealth: Payer: Self-pay | Admitting: Medical

## 2022-03-01 ENCOUNTER — Encounter: Payer: Self-pay | Admitting: Medical

## 2022-03-01 NOTE — Telephone Encounter (Signed)
Appeal letter typed  

## 2022-03-01 NOTE — Telephone Encounter (Signed)
[employer] APPEAL letter typed

## 2022-03-04 NOTE — Telephone Encounter (Signed)
Faxed appeal wouldn't go thru, called plan #[phone] and they didn't show a denied P.A. so I completed another P.A. online and rejection states not covered by plan, faxed this copy and appeal to correct fax # [phone]

## 2022-03-11 ENCOUNTER — Other Ambulatory Visit: Payer: Self-pay | Admitting: Medical

## 2022-03-23 ENCOUNTER — Other Ambulatory Visit: Payer: Self-pay | Admitting: Medical

## 2022-03-23 MED ORDER — AMLODIPINE BESYLATE 5 MG PO TABS: 5.0000 mg | ORAL_TABLET | Freq: Every day | ORAL | 1 refills | Status: DC

## 2022-04-05 NOTE — Telephone Encounter (Signed)
Appeal denied, any drug for weight loss is not covered by plan

## 2022-04-15 NOTE — Telephone Encounter (Signed)
Pt informed, also advised he could talk with his benefits dept at his job and see if they could add that as covered benefit in the future

## 2022-04-16 ENCOUNTER — Encounter: Payer: Self-pay | Admitting: Internal Medicine

## 2022-04-22 ENCOUNTER — Other Ambulatory Visit: Payer: Self-pay | Admitting: Medical

## 2022-04-29 ENCOUNTER — Ambulatory Visit: Payer: Self-pay

## 2022-04-29 ENCOUNTER — Ambulatory Visit (INDEPENDENT_AMBULATORY_CARE_PROVIDER_SITE_OTHER): Payer: BC Managed Care – PPO | Admitting: Orthopaedic Surgery

## 2022-04-29 ENCOUNTER — Ambulatory Visit (INDEPENDENT_AMBULATORY_CARE_PROVIDER_SITE_OTHER): Payer: BC Managed Care – PPO

## 2022-04-29 VITALS — BP 137/88 | HR 76

## 2022-04-29 DIAGNOSIS — M1711 Unilateral primary osteoarthritis, right knee: Secondary | ICD-10-CM

## 2022-04-29 DIAGNOSIS — M25512 Pain in left shoulder: Secondary | ICD-10-CM | POA: Diagnosis not present

## 2022-04-29 DIAGNOSIS — G8929 Other chronic pain: Secondary | ICD-10-CM | POA: Diagnosis not present

## 2022-04-29 DIAGNOSIS — M25511 Pain in right shoulder: Secondary | ICD-10-CM

## 2022-04-29 MED ORDER — TRAMADOL HCL 50 MG PO TABS: 50.0000 mg | ORAL_TABLET | Freq: Two times a day (BID) | ORAL | 2 refills | Status: DC | PRN

## 2022-04-29 NOTE — Progress Notes (Signed)
   Procedure Note  Patient: Tony Waller             Date of Birth: Jun 15, 1971           MRN: [id number]             Visit Date: 04/29/2022  Procedures: Visit Diagnoses:  1. Right shoulder pain, unspecified chronicity   2. Primary osteoarthritis of right knee   3. Pain in left acromioclavicular joint    US-guided [facility] Joint injection, Left shoulder After discussion on risks/benefits/indications, informed verbal consent was obtained. A timeout was then performed. The patient was seated in examination room. The area overlying the [facility] joint of the shoulder was prepped with Betadine and alcohol swab then utilizing ultrasound guidance, patient's AC joint was injected using a 25G, 1.5" needle with 0.5:1.59mL lidocaine:depomedrol of injectate via an out-of-plane, walk-down approach. Visualization of injectate flow was noted under ultrasound guidance. Patient tolerated the procedure well without immediate complications.  - I evaluated the patient about 5 minutes post-injection and they had improvement in pain and range of motion - follow-up with Dr. Erlinda Hong as indicated; I am happy to see them as needed  Elba Barman, DO [place]  This note was dictated using Dragon naturally speaking software and may contain errors in syntax, spelling, or content which have not been identified prior to signing this note.

## 2022-04-29 NOTE — Progress Notes (Signed)
Office Visit Note   Patient: Tony Waller           Date of Birth: 02/03/1971           MRN: [id number] Visit Date: 04/29/2022              Requested by: Jac Canavan, PA-C [address] [place],  [place] [address] PCP: Jac Canavan, PA-C   Assessment & Plan: Visit Diagnoses:  1. Pain in left acromioclavicular joint   2. Primary osteoarthritis of right knee   3. Chronic left shoulder pain     Plan: Impression is right knee OA exacerbation and probable degenerative medial meniscus tear in addition to left shoulder AC arthritis and possible biceps tendinitis.  In regards to the knee, we discussed proceeding with cortisone injection for which she is agreeable to.  I am hopeful that this will give him some degree of relief.  In regards to his shoulder, I believe his symptoms are primarily coming from the [facility] joint but is also having symptoms from probable biceps tendinitis.  We have made a referral to Dr. Shon Baton for ultrasound-guided [facility] joint injection.  If his symptoms continue to the anterior shoulder following this injection we will have him return for glenohumeral injection.  Follow-up as needed.  Follow-Up Instructions: Return if symptoms worsen or fail to improve.   Orders:  Orders Placed This Encounter  Procedures   XR Shoulder Left   XR KNEE 3 VIEW RIGHT   US Guided Needle Placement - No Linked Charges   Meds ordered this encounter  Medications   traMADol (ULTRAM) 50 MG tablet    Sig: Take 1 tablet (50 mg total) by mouth every 12 (twelve) hours as needed.    Dispense:  30 tablet    Refill:  2      Procedures: No procedures performed   Clinical Data: No additional findings.   Subjective: Chief Complaint  Patient presents with   Left Shoulder - Pain   Right Knee - Pain    HPI patient is a pleasant 51 year old gentleman who comes in today with right knee pain in addition to left shoulder pain.  Regards to his right knee.  He does have a history of  advanced degenerative joint disease and has previously undergone cortisone as well as viscosupplementation injections with good relief.  His last injection was of viscosupplementation back at the end of May of this year.  He is doing well until about 3 weeks ago when he slipped in his bathroom hyperflexing his knee and falling onto his back.  He has had increased pain to the medial and lateral joint lines as well as the popliteal fossa since.  Symptoms appear to be worse going from a seated to standing position as well as with pivoting.  He has been taking ibuprofen without significant relief.  In regards to his left shoulder, the pain initially began about 2 years ago with lifting weights.  He was seen by Dr. Prince Rome for [facility] joint injection was performed.  This significantly helped until recently.  His symptoms have started to return.  All of his pain is the top of the shoulder but does note occasional pain to the anterior aspect.  Symptoms occur when doing chest and shoulder workouts at the gym.   Review of Systems as detailed in HPI.  All others reviewed and are negative.   Objective: Vital Signs: BP 137/88   Pulse 76   Physical Exam well-developed well-nourished gentleman in  no acute distress.  Alert and oriented x3.  Ortho Exam right knee exam: No effusion.  Range of motion 0 to 120 degrees.  No tenderness patella.  No lateral joint line tenderness.  Moderate tenderness to the medial joint line.  No tenderness to MCL.  Stable vascular stress.  He is neurovascular intact distally.  Left shoulder exam reveals full active motion in all planes.  Full strength throughout.  Negative empty can.  Positive cross body, positive O'Brien's and positive speeds.  Moderate tenderness to the [facility] joint.  He is neurovascular intact distally.  Specialty Comments:  No specialty comments available.  Imaging: XR KNEE 3 VIEW RIGHT  Result Date: 04/29/2022 Moderate trigcompartmental degenerative changes  XR Shoulder  Left  Result Date: 04/29/2022 Moderate degenerative changes to the [facility] joint.  No other acute or structural abnormalities    PMFS History: Patient Active Problem List   Diagnosis Date Noted   BMI 37.0-37.9, adult 02/24/2022   Essential hypertension, benign 02/24/2022   Anxiety 11/13/2021   Impaired fasting blood sugar 02/20/2021   Encounter for health maintenance examination in adult 02/15/2021   Vitamin deficiency 02/15/2021   High risk medication use 02/15/2021   Elevated blood-pressure reading without diagnosis of hypertension 02/15/2021   Prostate enlargement 02/15/2021   Screening for heart disease 02/15/2021   Screening for diabetes mellitus 02/15/2021   Screening for prostate cancer 02/15/2021   Screening for thyroid disorder 02/15/2021   Vaccine counseling 02/15/2021   BMI 36.0-36.9,adult 11/01/2020   Osteoarthritis of both knees 11/01/2020   OSA (obstructive sleep apnea) 11/01/2020   Screen for colon cancer 11/01/2020   Arthrosis of left acromioclavicular joint 08/21/2020   Acute medial meniscus tear of left knee 04/13/2019   Stress fracture of left tibia 04/13/2019   ALLERGIC RHINITIS 05/28/2009   Past Medical History:  Diagnosis Date   ALLERGIC RHINITIS    Arthralgia    knees, shoulder   Arthritis    OSA (obstructive sleep apnea) 2011   on CPAP most nights   Wears contact lenses     Family History  Problem Relation Age of Onset   Allergies Mother    Diabetes Mother    Arthritis Mother    Lung cancer Paternal Grandfather    Cancer Paternal Grandfather    Brain cancer Maternal Uncle    Hyperlipidemia Father    Hypertension Father    Arthritis Brother    Stroke Maternal Grandmother    Heart disease Maternal Grandfather    Heart disease Paternal Grandmother     Past Surgical History:  Procedure Laterality Date   BUNIONECTOMY Left    KNEE ARTHROSCOPY WITH SUBCHONDROPLASTY Left 06/22/2019   Procedure: LEFT KNEE ARTHROSCOPY WITH PARTIAL MEDIAL  MENISCECTOMY, SUBCHONDROPLASTY AND EXCISION OF CYST;  Surgeon: Leandrew Koyanagi, MD;  Location: [facility];  Service: Orthopedics;  Laterality: Left;   NASAL SINUS SURGERY  2000   for deviated septum and turbonate reduction   ROTATOR CUFF REPAIR     right   TONSILLECTOMY  1994   Social History   Occupational History   Occupation: Scientist, product/process development: [employer]  Tobacco Use   Smoking status: Former    Types: Cigars   Smokeless tobacco: Never  Vaping Use   Vaping Use: Never used  Substance and Sexual Activity   Alcohol use: Yes    Alcohol/week: 4.0 standard drinks of alcohol    Types: 4 Standard drinks or equivalent per week    Comment:  occas   Drug use: Never   Sexual activity: Not on file

## 2022-05-05 ENCOUNTER — Ambulatory Visit (INDEPENDENT_AMBULATORY_CARE_PROVIDER_SITE_OTHER): Payer: BC Managed Care – PPO | Admitting: Podiatry

## 2022-05-05 ENCOUNTER — Encounter: Payer: Self-pay | Admitting: Podiatry

## 2022-05-05 ENCOUNTER — Ambulatory Visit (INDEPENDENT_AMBULATORY_CARE_PROVIDER_SITE_OTHER): Payer: BC Managed Care – PPO

## 2022-05-05 DIAGNOSIS — M722 Plantar fascial fibromatosis: Secondary | ICD-10-CM

## 2022-05-05 MED ORDER — MELOXICAM 15 MG PO TABS: 15.0000 mg | ORAL_TABLET | Freq: Every day | ORAL | 2 refills | Status: DC

## 2022-05-05 MED ORDER — TRIAMCINOLONE ACETONIDE 10 MG/ML IJ SUSP
10.0000 mg | Freq: Once | INTRAMUSCULAR | Status: AC
  Administered 2022-05-05: 10 mg

## 2022-05-05 NOTE — Progress Notes (Signed)
Subjective:   Patient ID: Tony Waller, male   DOB: 51 y.o.   MRN: [id number]   HPI Patient presents with pain in the right heel states that it was doing great for the last year and a half and it just came back 2 weeks ago   ROS      Objective:  Physical Exam  Neurovascular status intact with acute discomfort right plantar fascia at the insertion of the tendon into the calcaneus with fluid buildup noted     Assessment:  Acute Planter fasciitis right with inflammation fluid     Plan:  Sterile prep injected the plantar fascia right 3 mg Kenalog 5 mg Xylocaine instructed on anti-inflammatory supportive therapy reappoint to recheck

## 2022-05-08 ENCOUNTER — Ambulatory Visit: Payer: BC Managed Care – PPO | Admitting: Podiatry

## 2022-05-19 ENCOUNTER — Other Ambulatory Visit: Payer: Self-pay | Admitting: Medical

## 2022-05-20 ENCOUNTER — Encounter: Payer: Self-pay | Admitting: Internal Medicine

## 2022-06-25 ENCOUNTER — Other Ambulatory Visit: Payer: Self-pay | Admitting: Medical

## 2022-06-25 MED ORDER — ACYCLOVIR 400 MG PO TABS: 400.0000 mg | ORAL_TABLET | Freq: Two times a day (BID) | ORAL | 0 refills | Status: DC

## 2022-06-25 NOTE — Telephone Encounter (Signed)
From: Marco Collie To: Office of [place], [place] Sent: 06/25/2022 1:13 PM EST Subject: Medication Renewal Request  Refills have been requested for the following medications:   acyclovir (ZOVIRAX) 400 MG tablet [Shane Kaycie Pegues]  Preferred pharmacy: [id number] - [place], [place] - [address] AT [facility] [address] [facility] [address] Delivery method: Daryll Drown

## 2022-06-27 ENCOUNTER — Ambulatory Visit: Payer: BC Managed Care – PPO | Admitting: Podiatry

## 2022-07-01 ENCOUNTER — Ambulatory Visit (INDEPENDENT_AMBULATORY_CARE_PROVIDER_SITE_OTHER): Payer: BC Managed Care – PPO | Admitting: Orthopaedic Surgery

## 2022-07-01 DIAGNOSIS — M1711 Unilateral primary osteoarthritis, right knee: Secondary | ICD-10-CM

## 2022-07-01 NOTE — Progress Notes (Signed)
Office Visit Note   Patient: Tony Waller           Date of Birth: 06-Nov-1970           MRN: [id number] Visit Date: 07/01/2022              Requested by: Jac Canavan, PA-C [address] [place],  [place] [address] PCP: Jac Canavan, PA-C   Assessment & Plan: Visit Diagnoses:  1. Primary osteoarthritis of right knee     Plan: Impression is chronic right knee pain.  He only received temporary relief from cortisone injection.  At this point we will order MRI to rule out structural abnormalities.  Follow-up after the MRI.  Follow-Up Instructions: No follow-ups on file.   Orders:  Orders Placed This Encounter  Procedures   MR Knee Right w/o contrast   No orders of the defined types were placed in this encounter.     Procedures: No procedures performed   Clinical Data: No additional findings.   Subjective: Chief Complaint  Patient presents with   Right Knee - Pain    HPI Tony Waller comes in today for right knee pain.  He still has pain in the back of the knee and on the medial side.  He had a fall in the bathroom 2 months ago.  We did a cortisone injection in September which provided a few weeks of relief. Review of Systems   Objective: Vital Signs: There were no vitals taken for this visit.  Physical Exam  Ortho Exam Examination of the right knee shows medial joint line tenderness.  No joint effusion. Specialty Comments:  No specialty comments available.  Imaging: No results found.   PMFS History: Patient Active Problem List   Diagnosis Date Noted   BMI 37.0-37.9, adult 02/24/2022   Essential hypertension, benign 02/24/2022   Anxiety 11/13/2021   Impaired fasting blood sugar 02/20/2021   Encounter for health maintenance examination in adult 02/15/2021   Vitamin deficiency 02/15/2021   High risk medication use 02/15/2021   Elevated blood-pressure reading without diagnosis of hypertension 02/15/2021   Prostate enlargement 02/15/2021    Screening for heart disease 02/15/2021   Screening for diabetes mellitus 02/15/2021   Screening for prostate cancer 02/15/2021   Screening for thyroid disorder 02/15/2021   Vaccine counseling 02/15/2021   BMI 36.0-36.9,adult 11/01/2020   Osteoarthritis of both knees 11/01/2020   OSA (obstructive sleep apnea) 11/01/2020   Screen for colon cancer 11/01/2020   Arthrosis of left acromioclavicular joint 08/21/2020   Acute medial meniscus tear of left knee 04/13/2019   Stress fracture of left tibia 04/13/2019   ALLERGIC RHINITIS 05/28/2009   Past Medical History:  Diagnosis Date   ALLERGIC RHINITIS    Arthralgia    knees, shoulder   Arthritis    OSA (obstructive sleep apnea) 2011   on CPAP most nights   Wears contact lenses     Family History  Problem Relation Age of Onset   Allergies Mother    Diabetes Mother    Arthritis Mother    Lung cancer Paternal Grandfather    Cancer Paternal Grandfather    Brain cancer Maternal Uncle    Hyperlipidemia Father    Hypertension Father    Arthritis Brother    Stroke Maternal Grandmother    Heart disease Maternal Grandfather    Heart disease Paternal Grandmother     Past Surgical History:  Procedure Laterality Date   BUNIONECTOMY Left    KNEE ARTHROSCOPY WITH SUBCHONDROPLASTY  Left 06/22/2019   Procedure: LEFT KNEE ARTHROSCOPY WITH PARTIAL MEDIAL MENISCECTOMY, SUBCHONDROPLASTY AND EXCISION OF CYST;  Surgeon: Tarry Kos, MD;  Location: [facility];  Service: Orthopedics;  Laterality: Left;   NASAL SINUS SURGERY  2000   for deviated septum and turbonate reduction   ROTATOR CUFF REPAIR     right   TONSILLECTOMY  1994   Social History   Occupational History   Occupation: Psychologist, occupational: Advertising copywriter  Tobacco Use   Smoking status: Former    Types: Cigars   Smokeless tobacco: Never  Vaping Use   Vaping Use: Never used  Substance and Sexual Activity   Alcohol use: Yes    Alcohol/week: 4.0  standard drinks of alcohol    Types: 4 Standard drinks or equivalent per week    Comment: occas   Drug use: Never   Sexual activity: Not on file

## 2022-07-11 DIAGNOSIS — R635 Abnormal weight gain: Secondary | ICD-10-CM | POA: Diagnosis not present

## 2022-07-11 DIAGNOSIS — Z131 Encounter for screening for diabetes mellitus: Secondary | ICD-10-CM | POA: Diagnosis not present

## 2022-07-11 DIAGNOSIS — R030 Elevated blood-pressure reading, without diagnosis of hypertension: Secondary | ICD-10-CM | POA: Diagnosis not present

## 2022-07-11 DIAGNOSIS — Z6837 Body mass index (BMI) 37.0-37.9, adult: Secondary | ICD-10-CM | POA: Diagnosis not present

## 2022-07-11 DIAGNOSIS — R0602 Shortness of breath: Secondary | ICD-10-CM | POA: Diagnosis not present

## 2022-07-11 DIAGNOSIS — R5383 Other fatigue: Secondary | ICD-10-CM | POA: Diagnosis not present

## 2022-07-23 ENCOUNTER — Ambulatory Visit (INDEPENDENT_AMBULATORY_CARE_PROVIDER_SITE_OTHER): Payer: BC Managed Care – PPO | Admitting: Orthopaedic Surgery

## 2022-07-23 ENCOUNTER — Encounter: Payer: Self-pay | Admitting: Orthopaedic Surgery

## 2022-07-23 DIAGNOSIS — G8929 Other chronic pain: Secondary | ICD-10-CM

## 2022-07-23 DIAGNOSIS — M25512 Pain in left shoulder: Secondary | ICD-10-CM

## 2022-07-23 MED ORDER — METHYLPREDNISOLONE ACETATE 40 MG/ML IJ SUSP
40.0000 mg | INTRAMUSCULAR | Status: AC | PRN
  Administered 2022-07-23: 40 mg via INTRA_ARTICULAR

## 2022-07-23 MED ORDER — LIDOCAINE HCL 1 % IJ SOLN
3.0000 mL | INTRAMUSCULAR | Status: AC | PRN
  Administered 2022-07-23: 3 mL

## 2022-07-23 MED ORDER — BUPIVACAINE HCL 0.5 % IJ SOLN
3.0000 mL | INTRAMUSCULAR | Status: AC | PRN
  Administered 2022-07-23: 3 mL via INTRA_ARTICULAR

## 2022-07-23 NOTE — Progress Notes (Signed)
Office Visit Note   Patient: Tony Waller           Date of Birth: 1971-02-23           MRN: [id number] Visit Date: 07/23/2022              Requested by: Jac Canavan, PA-C [address] [place],  [place] [address] PCP: Jac Canavan, PA-C   Assessment & Plan: Visit Diagnoses:  1. Chronic left shoulder pain     Plan: Impression is left shoulder impingement syndrome.  I did review MRI from 2022 which shows unfavorable acromial anatomy predisposing to impingement.  Treatment options were are reviewed and we decided to move forward with a subacromial injection today.  Relative rest for the next couple weeks and then increase activity as tolerated.  Follow-up as needed.  Follow-Up Instructions: No follow-ups on file.   Orders:  No orders of the defined types were placed in this encounter.  No orders of the defined types were placed in this encounter.     Procedures: Large Joint Inj: L subacromial bursa on 07/23/2022 10:54 AM Indications: pain Details: 22 G needle  Arthrogram: No  Medications: 3 mL lidocaine 1 %; 3 mL bupivacaine 0.5 %; 40 mg methylPREDNISolone acetate 40 MG/ML Outcome: tolerated well, no immediate complications Patient was prepped and draped in the usual sterile fashion.       Clinical Data: No additional findings.   Subjective: Chief Complaint  Patient presents with   Left Shoulder - Pain    HPI Shown returns today for recurrent left shoulder pain.  Underwent ultrasound-guided AC joint injection about 3 months ago which helped for couple months.  He is fairly active and lifts weights 4-5 times a week. Review of Systems   Objective: Vital Signs: There were no vitals taken for this visit.  Physical Exam  Ortho Exam Examination of left shoulder shows no significant tenderness to the [facility] joint or in the acromion.  He has positive Hawkins impingement sign.  Negative Neer impingement.  Rotator cuff is normal to manual muscle  testing. Specialty Comments:  No specialty comments available.  Imaging: No results found.   PMFS History: Patient Active Problem List   Diagnosis Date Noted   BMI 37.0-37.9, adult 02/24/2022   Essential hypertension, benign 02/24/2022   Anxiety 11/13/2021   Impaired fasting blood sugar 02/20/2021   Encounter for health maintenance examination in adult 02/15/2021   Vitamin deficiency 02/15/2021   High risk medication use 02/15/2021   Elevated blood-pressure reading without diagnosis of hypertension 02/15/2021   Prostate enlargement 02/15/2021   Screening for heart disease 02/15/2021   Screening for diabetes mellitus 02/15/2021   Screening for prostate cancer 02/15/2021   Screening for thyroid disorder 02/15/2021   Vaccine counseling 02/15/2021   BMI 36.0-36.9,adult 11/01/2020   Osteoarthritis of both knees 11/01/2020   OSA (obstructive sleep apnea) 11/01/2020   Screen for colon cancer 11/01/2020   Arthrosis of left acromioclavicular joint 08/21/2020   Acute medial meniscus tear of left knee 04/13/2019   Stress fracture of left tibia 04/13/2019   ALLERGIC RHINITIS 05/28/2009   Past Medical History:  Diagnosis Date   ALLERGIC RHINITIS    Arthralgia    knees, shoulder   Arthritis    OSA (obstructive sleep apnea) 2011   on CPAP most nights   Wears contact lenses     Family History  Problem Relation Age of Onset   Allergies Mother    Diabetes Mother  Arthritis Mother    Lung cancer Paternal Grandfather    Cancer Paternal Grandfather    Brain cancer Maternal Uncle    Hyperlipidemia Father    Hypertension Father    Arthritis Brother    Stroke Maternal Grandmother    Heart disease Maternal Grandfather    Heart disease Paternal Grandmother     Past Surgical History:  Procedure Laterality Date   BUNIONECTOMY Left    KNEE ARTHROSCOPY WITH SUBCHONDROPLASTY Left 06/22/2019   Procedure: LEFT KNEE ARTHROSCOPY WITH PARTIAL MEDIAL MENISCECTOMY, SUBCHONDROPLASTY AND  EXCISION OF CYST;  Surgeon: Tarry Kos, MD;  Location: [facility];  Service: Orthopedics;  Laterality: Left;   NASAL SINUS SURGERY  2000   for deviated septum and turbonate reduction   ROTATOR CUFF REPAIR     right   TONSILLECTOMY  1994   Social History   Occupational History   Occupation: Psychologist, occupational: Advertising copywriter  Tobacco Use   Smoking status: Former    Types: Cigars   Smokeless tobacco: Never  Vaping Use   Vaping Use: Never used  Substance and Sexual Activity   Alcohol use: Yes    Alcohol/week: 4.0 standard drinks of alcohol    Types: 4 Standard drinks or equivalent per week    Comment: occas   Drug use: Never   Sexual activity: Not on file

## 2022-07-24 ENCOUNTER — Ambulatory Visit
Admission: RE | Admit: 2022-07-24 | Discharge: 2022-07-24 | Disposition: A | Discharge status: Home / Self Care | Payer: BC Managed Care – PPO | Source: Ambulatory Visit | Attending: Orthopaedic Surgery | Admitting: Orthopaedic Surgery

## 2022-07-24 DIAGNOSIS — M1711 Unilateral primary osteoarthritis, right knee: Secondary | ICD-10-CM

## 2022-07-24 DIAGNOSIS — M25561 Pain in right knee: Secondary | ICD-10-CM | POA: Diagnosis not present

## 2022-07-29 ENCOUNTER — Ambulatory Visit (INDEPENDENT_AMBULATORY_CARE_PROVIDER_SITE_OTHER): Payer: BC Managed Care – PPO | Admitting: Orthopaedic Surgery

## 2022-07-29 DIAGNOSIS — M1712 Unilateral primary osteoarthritis, left knee: Secondary | ICD-10-CM | POA: Diagnosis not present

## 2022-07-29 DIAGNOSIS — M1711 Unilateral primary osteoarthritis, right knee: Secondary | ICD-10-CM | POA: Diagnosis not present

## 2022-07-29 DIAGNOSIS — M170 Bilateral primary osteoarthritis of knee: Secondary | ICD-10-CM

## 2022-07-29 NOTE — Progress Notes (Signed)
Office Visit Note   Patient: Tony Waller           Date of Birth: 08/16/1970           MRN: [id number] Visit Date: 07/29/2022              Requested by: Jac Canavan, PA-C [address] [place],  [place] [address] PCP: Jac Canavan, PA-C   Assessment & Plan: Visit Diagnoses:  1. Primary osteoarthritis of left knee   2. Primary osteoarthritis of right knee     Plan: The MRI scan shows predominantly tricompartmental degenerative joint disease.  He has full-thickness cartilage loss of the medial and patellofemoral compartments.  He also has a complex tear of the posterior horn of the medial meniscus.  These findings were reviewed with the patient and his wife and treatment options were discussed to include continued management for the osteoarthritis versus a more definitive treatment of a knee replacement.  Risk benefits rehab recovery reviewed with the patient.  They will think about their options and let me know.  Follow-Up Instructions: No follow-ups on file.   Orders:  No orders of the defined types were placed in this encounter.  No orders of the defined types were placed in this encounter.     Procedures: No procedures performed   Clinical Data: No additional findings.   Subjective: Chief Complaint  Patient presents with   Other    Scan review    HPI Tony Waller returns today with his wife to discuss MRI right knee.  Denies any changes in symptoms.  Review of Systems   Objective: Vital Signs: There were no vitals taken for this visit.  Physical Exam  Ortho Exam Examination of the right knee is unchanged. Specialty Comments:  No specialty comments available.  Imaging: No results found.   PMFS History: Patient Active Problem List   Diagnosis Date Noted   BMI 37.0-37.9, adult 02/24/2022   Essential hypertension, benign 02/24/2022   Anxiety 11/13/2021   Impaired fasting blood sugar 02/20/2021   Encounter for health maintenance examination in  adult 02/15/2021   Vitamin deficiency 02/15/2021   High risk medication use 02/15/2021   Elevated blood-pressure reading without diagnosis of hypertension 02/15/2021   Prostate enlargement 02/15/2021   Screening for heart disease 02/15/2021   Screening for diabetes mellitus 02/15/2021   Screening for prostate cancer 02/15/2021   Screening for thyroid disorder 02/15/2021   Vaccine counseling 02/15/2021   BMI 36.0-36.9,adult 11/01/2020   Osteoarthritis of both knees 11/01/2020   OSA (obstructive sleep apnea) 11/01/2020   Screen for colon cancer 11/01/2020   Arthrosis of left acromioclavicular joint 08/21/2020   Acute medial meniscus tear of left knee 04/13/2019   Stress fracture of left tibia 04/13/2019   ALLERGIC RHINITIS 05/28/2009   Past Medical History:  Diagnosis Date   ALLERGIC RHINITIS    Arthralgia    knees, shoulder   Arthritis    OSA (obstructive sleep apnea) 2011   on CPAP most nights   Wears contact lenses     Family History  Problem Relation Age of Onset   Allergies Mother    Diabetes Mother    Arthritis Mother    Lung cancer Paternal Grandfather    Cancer Paternal Grandfather    Brain cancer Maternal Uncle    Hyperlipidemia Father    Hypertension Father    Arthritis Brother    Stroke Maternal Grandmother    Heart disease Maternal Grandfather    Heart disease Paternal  Grandmother     Past Surgical History:  Procedure Laterality Date   BUNIONECTOMY Left    KNEE ARTHROSCOPY WITH SUBCHONDROPLASTY Left 06/22/2019   Procedure: LEFT KNEE ARTHROSCOPY WITH PARTIAL MEDIAL MENISCECTOMY, SUBCHONDROPLASTY AND EXCISION OF CYST;  Surgeon: Tarry Kos, MD;  Location: [facility];  Service: Orthopedics;  Laterality: Left;   NASAL SINUS SURGERY  2000   for deviated septum and turbonate reduction   ROTATOR CUFF REPAIR     right   TONSILLECTOMY  1994   Social History   Occupational History   Occupation: Psychologist, occupational: Education officer, community  Tobacco Use   Smoking status: Former    Types: Cigars   Smokeless tobacco: Never  Vaping Use   Vaping Use: Never used  Substance and Sexual Activity   Alcohol use: Yes    Alcohol/week: 4.0 standard drinks of alcohol    Types: 4 Standard drinks or equivalent per week    Comment: occas   Drug use: Never   Sexual activity: Not on file

## 2022-11-12 ENCOUNTER — Ambulatory Visit (INDEPENDENT_AMBULATORY_CARE_PROVIDER_SITE_OTHER): Payer: BC Managed Care – PPO | Admitting: Orthopaedic Surgery

## 2022-11-12 DIAGNOSIS — M7542 Impingement syndrome of left shoulder: Secondary | ICD-10-CM

## 2022-11-12 DIAGNOSIS — M1712 Unilateral primary osteoarthritis, left knee: Secondary | ICD-10-CM | POA: Diagnosis not present

## 2022-11-12 DIAGNOSIS — M75112 Incomplete rotator cuff tear or rupture of left shoulder, not specified as traumatic: Secondary | ICD-10-CM | POA: Diagnosis not present

## 2022-11-12 MED ORDER — LIDOCAINE HCL 1 % IJ SOLN
2.0000 mL | INTRAMUSCULAR | Status: AC | PRN
  Administered 2022-11-12: 2 mL

## 2022-11-12 MED ORDER — METHYLPREDNISOLONE ACETATE 40 MG/ML IJ SUSP
40.0000 mg | INTRAMUSCULAR | Status: AC | PRN
  Administered 2022-11-12: 40 mg via INTRA_ARTICULAR

## 2022-11-12 MED ORDER — BUPIVACAINE HCL 0.5 % IJ SOLN
2.0000 mL | INTRAMUSCULAR | Status: AC | PRN
  Administered 2022-11-12: 2 mL via INTRA_ARTICULAR

## 2022-11-12 NOTE — Progress Notes (Signed)
Office Visit Note   Patient: Tony Waller           Date of Birth: 1971/05/23           MRN: [id number] Visit Date: 11/12/2022              Requested by: Carlena Hurl, PA-C [address] [place],  [place] [address] PCP: Carlena Hurl, PA-C   Assessment & Plan: Visit Diagnoses:  1. Impingement syndrome of left shoulder   2. Unilateral primary osteoarthritis, left knee     Plan: Impression is left shoulder impingement syndrome and left knee osteoarthritis flareup.  In regards to the left shoulder, he is undergone subacromial cortisone injection with only temporary relief.  His symptoms are to the point he is ready to proceed with the next stage of treatment.  We have discussed left shoulder arthroscopic debridement, subacromial decompression for which he is agreeable to.  Risk, benefits and possible complications reviewed.  Rehab recovery time discussed.  All questions were answered.  Tony Waller will call the patient to confirm surgery time.    In regards to the left knee, we have discussed repeat cortisone injection for which she is agreeable to.  Follow-up as needed for his knee.  Follow-Up Instructions: Return for post op.   Orders:  No orders of the defined types were placed in this encounter.  No orders of the defined types were placed in this encounter.     Procedures: Large Joint Inj: L knee on 11/12/2022 4:42 PM Details: 22 G needle Medications: 2 mL bupivacaine 0.5 %; 2 mL lidocaine 1 %; 40 mg methylPREDNISolone acetate 40 MG/ML Outcome: tolerated well, no immediate complications Patient was prepped and draped in the usual sterile fashion.       Clinical Data: No additional findings.   Subjective: Chief Complaint  Patient presents with   Left Shoulder - Pain    HPI patient is a pleasant 52 year old gentleman who comes in today with chronic left shoulder and left knee pain.  Regards to his left shoulder, history of impingement syndrome diagnosed after MRI  in 2022.  He underwent subacromial cortisone injection on 07/23/2022 which helped for about 2 months.  His symptoms have returned.  The pain is to the anterior and superior aspects of the shoulder.  Symptoms appear to be worse with the extremes of range of motion especially with abduction.  He has associated weakness.  He has tried multiple medications for pain all without relief.  In regards to his left knee, he has a history of osteoarthritis.  He has undergone cortisone as well as viscosupplementation injections in the past with good relief.  The pain he is currently having is primarily to the medial and posterior aspects of the knee.  Symptoms are worse with activity such as walking as well as stair climbing.  Review of Systems as detailed in HPI.  All others reviewed and are negative.   Objective: Vital Signs: There were no vitals taken for this visit.  Physical Exam well-developed well-nourished gentleman in no acute distress.  Alert and oriented x 3.  Ortho Exam left shoulder exam reveals forward flexion to approximately 150 degrees.  He can abduct to approximately 90 degrees.  He does have pain and slight limitation with internal and external rotation.  Moderate pain slight weakness with empty can testing.  He is neurovascularly intact distally.  Specialty Comments:  No specialty comments available.  Imaging: No new imaging   PMFS History: Patient Active Problem  List   Diagnosis Date Noted   BMI 37.0-37.9, adult 02/24/2022   Essential hypertension, benign 02/24/2022   Anxiety 11/13/2021   Impaired fasting blood sugar 02/20/2021   Encounter for health maintenance examination in adult 02/15/2021   Vitamin deficiency 02/15/2021   High risk medication use 02/15/2021   Elevated blood-pressure reading without diagnosis of hypertension 02/15/2021   Prostate enlargement 02/15/2021   Screening for heart disease 02/15/2021   Screening for diabetes mellitus 02/15/2021   Screening for  prostate cancer 02/15/2021   Screening for thyroid disorder 02/15/2021   Vaccine counseling 02/15/2021   BMI 36.0-36.9,adult 11/01/2020   Osteoarthritis of both knees 11/01/2020   OSA (obstructive sleep apnea) 11/01/2020   Screen for colon cancer 11/01/2020   Arthrosis of left acromioclavicular joint 08/21/2020   Acute medial meniscus tear of left knee 04/13/2019   Stress fracture of left tibia 04/13/2019   ALLERGIC RHINITIS 05/28/2009   Past Medical History:  Diagnosis Date   ALLERGIC RHINITIS    Arthralgia    knees, shoulder   Arthritis    OSA (obstructive sleep apnea) 2011   on CPAP most nights   Wears contact lenses     Family History  Problem Relation Age of Onset   Allergies Mother    Diabetes Mother    Arthritis Mother    Lung cancer Paternal Grandfather    Cancer Paternal Grandfather    Brain cancer Maternal Uncle    Hyperlipidemia Father    Hypertension Father    Arthritis Brother    Stroke Maternal Grandmother    Heart disease Maternal Grandfather    Heart disease Paternal Grandmother     Past Surgical History:  Procedure Laterality Date   BUNIONECTOMY Left    KNEE ARTHROSCOPY WITH SUBCHONDROPLASTY Left 06/22/2019   Procedure: LEFT KNEE ARTHROSCOPY WITH PARTIAL MEDIAL MENISCECTOMY, SUBCHONDROPLASTY AND EXCISION OF CYST;  Surgeon: Leandrew Koyanagi, MD;  Location: [facility];  Service: Orthopedics;  Laterality: Left;   NASAL SINUS SURGERY  2000   for deviated septum and turbonate reduction   ROTATOR CUFF REPAIR     right   TONSILLECTOMY  1994   Social History   Occupational History   Occupation: Scientist, 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

## 2022-11-25 ENCOUNTER — Other Ambulatory Visit: Payer: Self-pay | Admitting: Medical

## 2022-12-08 ENCOUNTER — Encounter: Payer: Self-pay | Admitting: Orthopaedic Surgery

## 2022-12-10 ENCOUNTER — Ambulatory Visit (INDEPENDENT_AMBULATORY_CARE_PROVIDER_SITE_OTHER): Payer: BC Managed Care – PPO | Admitting: Orthopaedic Surgery

## 2022-12-10 ENCOUNTER — Encounter: Payer: Self-pay | Admitting: Orthopaedic Surgery

## 2022-12-10 DIAGNOSIS — M7542 Impingement syndrome of left shoulder: Secondary | ICD-10-CM | POA: Diagnosis not present

## 2022-12-10 MED ORDER — BUPIVACAINE HCL 0.5 % IJ SOLN
3.0000 mL | INTRAMUSCULAR | Status: AC | PRN
  Administered 2022-12-10: 3 mL via INTRA_ARTICULAR

## 2022-12-10 MED ORDER — LIDOCAINE HCL 1 % IJ SOLN
3.0000 mL | INTRAMUSCULAR | Status: AC | PRN
  Administered 2022-12-10: 3 mL

## 2022-12-10 MED ORDER — METHYLPREDNISOLONE ACETATE 40 MG/ML IJ SUSP
40.0000 mg | INTRAMUSCULAR | Status: AC | PRN
  Administered 2022-12-10: 40 mg via INTRA_ARTICULAR

## 2022-12-10 NOTE — Progress Notes (Signed)
Office Visit Note   Patient: Tony Waller           Date of Birth: May 17, 1971           MRN: [id number] Visit Date: 12/10/2022              Requested by: Jac Canavan, PA-C [address] [place],  [place] [address] PCP: Jac Canavan, PA-C   Assessment & Plan: Visit Diagnoses:  1. Impingement syndrome of left shoulder     Plan: Impression is chronic left shoulder pain with underlying impingement syndrome and AC joint arthritis.  Various treatment options were again discussed today to include repeat subacromial versus AC joint injection versus surgical intervention.  He notes that the subacromial injection has helped most in the past and would like to repeat this.  He would like to wait until the fall to proceed with surgery.  Follow-up with [place] as needed.  Follow-Up Instructions: Return if symptoms worsen or fail to improve.   Orders:  No orders of the defined types were placed in this encounter.  No orders of the defined types were placed in this encounter.     Procedures: Large Joint Inj: L subacromial bursa on 12/10/2022 9:32 AM Indications: pain Details: 22 G needle  Arthrogram: No  Medications: 3 mL lidocaine 1 %; 3 mL bupivacaine 0.5 %; 40 mg methylPREDNISolone acetate 40 MG/ML Outcome: tolerated well, no immediate complications Patient was prepped and draped in the usual sterile fashion.       Clinical Data: No additional findings.   Subjective: Chief Complaint  Patient presents with   Left Shoulder - Pain    HPI patient is a pleasant 52 year old gentleman who comes in today with recurrent left shoulder pain.  History of impingement syndrome and AC arthritis seen on MRI from the fall.  He is undergone subacromial injection as well as AC joint injection in the past.  Previous subacromial injection in December helped for about 2 months.  Surgery was discussed a few months later but patient currently notes he needs to wait till the fall to proceed with  this.  He is here today requesting a repeat subacromial cortisone injection.  He is currently having pain to the top and anterior aspects of the left shoulder.  Worse with lifting his arm.  He has tried ibuprofen without relief.  Review of Systems as detailed in HPI.  All others reviewed and are negative.   Objective: Vital Signs: There were no vitals taken for this visit.  Physical Exam well-developed well-nourished gentleman in no acute distress.  Alert and oriented x 3.  Ortho Exam left shoulder exam reveals near full active range of motion in all planes.  Pain with empty can test.  Tender to palpation over the [facility] joint.  Full strength throughout.  He is neurovascular intact distally.  Specialty Comments:  No specialty comments available.  Imaging: No results found.   PMFS History: Patient Active Problem List   Diagnosis Date Noted   BMI 37.0-37.9, adult 02/24/2022   Essential hypertension, benign 02/24/2022   Anxiety 11/13/2021   Impaired fasting blood sugar 02/20/2021   Encounter for health maintenance examination in adult 02/15/2021   Vitamin deficiency 02/15/2021   High risk medication use 02/15/2021   Elevated blood-pressure reading without diagnosis of hypertension 02/15/2021   Prostate enlargement 02/15/2021   Screening for heart disease 02/15/2021   Screening for diabetes mellitus 02/15/2021   Screening for prostate cancer 02/15/2021   Screening for thyroid  disorder 02/15/2021   Vaccine counseling 02/15/2021   BMI 36.0-36.9,adult 11/01/2020   Osteoarthritis of both knees 11/01/2020   OSA (obstructive sleep apnea) 11/01/2020   Screen for colon cancer 11/01/2020   Arthrosis of left acromioclavicular joint 08/21/2020   Acute medial meniscus tear of left knee 04/13/2019   Stress fracture of left tibia 04/13/2019   ALLERGIC RHINITIS 05/28/2009   Past Medical History:  Diagnosis Date   ALLERGIC RHINITIS    Arthralgia    knees, shoulder   Arthritis    OSA  (obstructive sleep apnea) 2011   on CPAP most nights   Wears contact lenses     Family History  Problem Relation Age of Onset   Allergies Mother    Diabetes Mother    Arthritis Mother    Lung cancer Paternal Grandfather    Cancer Paternal Grandfather    Brain cancer Maternal Uncle    Hyperlipidemia Father    Hypertension Father    Arthritis Brother    Stroke Maternal Grandmother    Heart disease Maternal Grandfather    Heart disease Paternal Grandmother     Past Surgical History:  Procedure Laterality Date   BUNIONECTOMY Left    KNEE ARTHROSCOPY WITH SUBCHONDROPLASTY Left 06/22/2019   Procedure: LEFT KNEE ARTHROSCOPY WITH PARTIAL MEDIAL MENISCECTOMY, SUBCHONDROPLASTY AND EXCISION OF CYST;  Surgeon: Tarry Kos, MD;  Location: [facility];  Service: Orthopedics;  Laterality: Left;   NASAL SINUS SURGERY  2000   for deviated septum and turbonate reduction   ROTATOR CUFF REPAIR     right   TONSILLECTOMY  1994   Social History   Occupational History   Occupation: Psychologist, occupational: Advertising copywriter  Tobacco Use   Smoking status: Former    Types: Cigars   Smokeless tobacco: Never  Vaping Use   Vaping Use: Never used  Substance and Sexual Activity   Alcohol use: Yes    Alcohol/week: 4.0 standard drinks of alcohol    Types: 4 Standard drinks or equivalent per week    Comment: occas   Drug use: Never   Sexual activity: Not on file

## 2022-12-11 ENCOUNTER — Other Ambulatory Visit: Payer: Self-pay | Admitting: Physician Assistant

## 2022-12-11 MED ORDER — ACETAMINOPHEN-CODEINE 300-30 MG PO TABS: 1.0000 | ORAL_TABLET | Freq: Two times a day (BID) | ORAL | 0 refills | Status: DC | PRN

## 2022-12-11 NOTE — Telephone Encounter (Signed)
sent 

## 2022-12-11 NOTE — Telephone Encounter (Signed)
See message.

## 2022-12-17 ENCOUNTER — Other Ambulatory Visit: Payer: Self-pay | Admitting: Physician Assistant

## 2022-12-17 MED ORDER — IBUPROFEN 800 MG PO TABS: 800.0000 mg | ORAL_TABLET | Freq: Three times a day (TID) | ORAL | 2 refills | Status: DC | PRN

## 2022-12-17 NOTE — Telephone Encounter (Signed)
sent 

## 2022-12-17 NOTE — Telephone Encounter (Signed)
Tramadol would be the alternative and I am happy to send in if he would like

## 2022-12-29 DIAGNOSIS — Z1211 Encounter for screening for malignant neoplasm of colon: Secondary | ICD-10-CM

## 2023-01-16 ENCOUNTER — Telehealth: Payer: Self-pay

## 2023-01-16 ENCOUNTER — Ambulatory Visit (AMBULATORY_SURGERY_CENTER): Payer: BC Managed Care – PPO

## 2023-01-16 VITALS — Ht 73.5 in | Wt 284.0 lb

## 2023-01-16 DIAGNOSIS — Z1211 Encounter for screening for malignant neoplasm of colon: Secondary | ICD-10-CM

## 2023-01-16 MED ORDER — NA SULFATE-K SULFATE-MG SULF 17.5-3.13-1.6 GM/177ML PO SOLN: 1.0000 | Freq: Once | ORAL | 0 refills | Status: AC

## 2023-01-16 NOTE — Telephone Encounter (Signed)
PV completed.  °

## 2023-01-16 NOTE — Progress Notes (Signed)
No egg or soy allergy known to patient  No issues known to pt with past sedation with any surgeries or procedures Patient denies ever being told they had issues or difficulty with intubation  No FH of Malignant Hyperthermia Pt is not on diet pills Pt is not on  home 02  Pt is not on blood thinners  Pt denies issues with constipation  No A fib or A flutter Have any cardiac testing pending--no  Pt is ambulatory   PV completed. Prep instructions reviewed with patient. Sent out via [place] and to home address.  Good rx coupon for [facility] provided Pt instructed to use Singlecare.com or GoodRx for a price reduction on prep

## 2023-01-21 ENCOUNTER — Encounter: Payer: Self-pay | Admitting: Gastroenterology

## 2023-02-05 ENCOUNTER — Ambulatory Visit (AMBULATORY_SURGERY_CENTER): Payer: BC Managed Care – PPO | Admitting: Gastroenterology

## 2023-02-05 ENCOUNTER — Encounter: Payer: Self-pay | Admitting: Gastroenterology

## 2023-02-05 VITALS — BP 138/84 | HR 74 | Temp 97.5°F | Resp 20 | Ht 73.5 in | Wt 284.0 lb

## 2023-02-05 DIAGNOSIS — D125 Benign neoplasm of sigmoid colon: Secondary | ICD-10-CM

## 2023-02-05 DIAGNOSIS — Z1211 Encounter for screening for malignant neoplasm of colon: Secondary | ICD-10-CM

## 2023-02-05 MED ORDER — SODIUM CHLORIDE 0.9 % IV SOLN: 500.0000 mL | Freq: Once | INTRAVENOUS | Status: DC

## 2023-02-05 NOTE — Progress Notes (Signed)
Uneventful anesthetic. Report to pacu rn. Vss. Care resumed by rn. 

## 2023-02-05 NOTE — Progress Notes (Signed)
[facility] History and Physical   Primary Care Physician:  Jac Canavan, PA-C   Reason for Procedure:   Colon cancer screening  Plan:    Screening colonoscopy     HPI: Tony Waller is a 52 y.o. male undergoing initial average risk screening colonoscopy.  He has no family history of colon cancer and no chronic GI symptoms.    Past Medical History:  Diagnosis Date   ALLERGIC RHINITIS    Arthralgia    knees, shoulder   Arthritis    OSA (obstructive sleep apnea) 2011   on CPAP most nights   Wears contact lenses     Past Surgical History:  Procedure Laterality Date   BUNIONECTOMY Left    KNEE ARTHROSCOPY WITH SUBCHONDROPLASTY Left 06/22/2019   Procedure: LEFT KNEE ARTHROSCOPY WITH PARTIAL MEDIAL MENISCECTOMY, SUBCHONDROPLASTY AND EXCISION OF CYST;  Surgeon: Tarry Kos, MD;  [facility];  Service: Orthopedics;  Laterality: Left;   NASAL SINUS SURGERY  2000   for deviated septum and turbonate reduction   ROTATOR CUFF REPAIR     right   TONSILLECTOMY  1994    Prior to Admission medications   Medication Sig Start Date End Date Taking? Authorizing Provider  acyclovir (ZOVIRAX) 400 MG tablet Take 1 tablet (400 mg total) by mouth 2 (two) times daily. 06/25/22  Yes Tysinger, Kermit Balo, PA-C  amLODipine (NORVASC) 5 MG tablet TAKE 1 TABLET (5 MG TOTAL) BY MOUTH DAILY. 11/25/22 11/25/23 Yes Tysinger, Kermit Balo, PA-C  Vitamin D, Ergocalciferol, (DRISDOL) 1.25 MG (50000 UNIT) CAPS capsule Take 1 capsule (50,000 Units total) by mouth every 7 (seven) days. 11/13/21  Yes Tysinger, Kermit Balo, PA-C  acetaminophen-codeine (TYLENOL #3) 300-30 MG tablet Take 1 tablet by mouth 2 (two) times daily as needed for moderate pain. 12/11/22   Cristie Hem, PA-C  buPROPion (WELLBUTRIN XL) 300 MG 24 hr tablet TAKE 1 TABLET BY MOUTH EVERY DAY 11/25/22   Tysinger, Kermit Balo, PA-C  ibuprofen (ADVIL) 600 MG tablet Take 1 tablet (600 mg total) by mouth every 8 (eight) hours as  needed. 12/12/21   Cristie Hem, PA-C  ibuprofen (ADVIL) 800 MG tablet Take 1 tablet (800 mg total) by mouth every 8 (eight) hours as needed. 12/17/22   Cristie Hem, PA-C  meloxicam (MOBIC) 15 MG tablet Take 1 tablet (15 mg total) by mouth daily. Patient not taking: Reported on 01/16/2023 05/05/22   Lenn Sink, DPM  traMADol (ULTRAM) 50 MG tablet Take 1 tablet (50 mg total) by mouth every 12 (twelve) hours as needed. 04/29/22   Cristie Hem, PA-C    Current Outpatient Medications  Medication Sig Dispense Refill   acyclovir (ZOVIRAX) 400 MG tablet Take 1 tablet (400 mg total) by mouth 2 (two) times daily. 180 tablet 0   amLODipine (NORVASC) 5 MG tablet TAKE 1 TABLET (5 MG TOTAL) BY MOUTH DAILY. 90 tablet 0   Vitamin D, Ergocalciferol, (DRISDOL) 1.25 MG (50000 UNIT) CAPS capsule Take 1 capsule (50,000 Units total) by mouth every 7 (seven) days. 12 capsule 3   acetaminophen-codeine (TYLENOL #3) 300-30 MG tablet Take 1 tablet by mouth 2 (two) times daily as needed for moderate pain. 30 tablet 0   buPROPion (WELLBUTRIN XL) 300 MG 24 hr tablet TAKE 1 TABLET BY MOUTH EVERY DAY 90 tablet 0   ibuprofen (ADVIL) 600 MG tablet Take 1 tablet (600 mg total) by mouth every 8 (eight) hours as needed. 60 tablet 2  ibuprofen (ADVIL) 800 MG tablet Take 1 tablet (800 mg total) by mouth every 8 (eight) hours as needed. 60 tablet 2   meloxicam (MOBIC) 15 MG tablet Take 1 tablet (15 mg total) by mouth daily. (Patient not taking: Reported on 01/16/2023) 30 tablet 2   traMADol (ULTRAM) 50 MG tablet Take 1 tablet (50 mg total) by mouth every 12 (twelve) hours as needed. 30 tablet 2   Current Facility-Administered Medications  Medication Dose Route Frequency Provider Last Rate Last Admin   0.9 %  sodium chloride infusion  500 mL Intravenous Once Jenel Lucks, MD        Allergies as of 02/05/2023 - Review Complete 02/05/2023  Allergen Reaction Noted   Penicillins Hives     Family History  Problem  Relation Age of Onset   Allergies Mother    Diabetes Mother    Arthritis Mother    Hyperlipidemia Father    Hypertension Father    Arthritis Brother    Brain cancer Maternal Uncle    Stroke Maternal Grandmother    Heart disease Maternal Grandfather    Heart disease Paternal Grandmother    Lung cancer Paternal Grandfather    Cancer Paternal Grandfather    Colon cancer Neg Hx    Colon polyps Neg Hx    Rectal cancer Neg Hx    Stomach cancer Neg Hx    Esophageal cancer Neg Hx     Social History   Socioeconomic History   Marital status: Married    Spouse name: Not on file   Number of children: Not on file   Years of education: Not on file   Highest education level: Not on file  Occupational History   Occupation: Camera operator    Employer: Advertising copywriter  Tobacco Use   Smoking status: Former    Types: Cigars   Smokeless tobacco: Never  Vaping Use   Vaping Use: Never used  Substance and Sexual Activity   Alcohol use: Yes    Alcohol/week: 4.0 standard drinks of alcohol    Types: 4 Standard drinks or equivalent per week    Comment: occas   Drug use: Never   Sexual activity: Not on file  Other Topics Concern   Not on file  Social History Narrative   Married w/ children.  Works for [employer], Mud[employer].   02/2021   Social Determinants of Health   Financial Resource Strain: Not on file  Food Insecurity: Not on file  Transportation Needs: Not on file  Physical Activity: Not on file  Stress: Not on file  Social Connections: Not on file  Intimate Partner Violence: Not on file    Review of Systems:  All other review of systems negative except as mentioned in the HPI.  Physical Exam: Vital signs BP (!) 143/70   Pulse 69   Temp (!) 97.5 F (36.4 C)   Ht 6' 1.5" (1.867 m)   Wt 284 lb (128.8 kg)   SpO2 96%   BMI 36.96 kg/m   General:   Alert,  Well-developed, well-nourished, pleasant and cooperative in NAD Airway:  Mallampati 1 Lungs:  Clear  throughout to auscultation.   Heart:  Regular rate and rhythm; no murmurs, clicks, rubs,  or gallops. Abdomen:  Soft, nontender and nondistended. Normal bowel sounds.   Neuro/Psych:  Normal mood and affect. A and O x 3   Thoma Paulsen E. Tomasa Rand, MD [facility]

## 2023-02-05 NOTE — Op Note (Signed)
[facility] Patient Name: Tony Waller Procedure Date: 02/05/2023 9:28 AM MRN: [id number] Endoscopist: Lorin Picket E. Tomasa Rand , MD, [id number] Age: 52 Referring MD:  Date of Birth: 1971-02-16 Gender: Male Account #: [id number] Procedure:                Colonoscopy Indications:              Screening for colorectal malignant neoplasm, This                            is the patient's first colonoscopy Medicines:                Monitored Anesthesia Care Procedure:                Pre-Anesthesia Assessment:                           - Prior to the procedure, a History and Physical                            was performed, and patient medications and                            allergies were reviewed. The patient's tolerance of                            previous anesthesia was also reviewed. The risks                            and benefits of the procedure and the sedation                            options and risks were discussed with the patient.                            All questions were answered, and informed consent                            was obtained. Prior Anticoagulants: The patient has                            taken no anticoagulant or antiplatelet agents. ASA                            Grade Assessment: II - A patient with mild systemic                            disease. After reviewing the risks and benefits,                            the patient was deemed in satisfactory condition to                            undergo the procedure.  After obtaining informed consent, the colonoscope                            was passed under direct vision. Throughout the                            procedure, the patient's blood pressure, pulse, and                            oxygen saturations were monitored continuously. The                            Olympus CF-HQ190L [id number]) Colonoscope was                            introduced through the anus  and advanced to the the                            cecum, identified by appendiceal orifice and                            ileocecal valve. The colonoscopy was performed                            without difficulty. The patient tolerated the                            procedure well. The quality of the bowel                            preparation was adequate. The ileocecal valve,                            appendiceal orifice, and rectum were photographed.                            The bowel preparation used was SUPREP via split                            dose instruction. Scope In: 9:33:05 AM Scope Out: 9:52:04 AM Scope Withdrawal Time: 0 hours 12 minutes 42 seconds  Total Procedure Duration: 0 hours 18 minutes 59 seconds  Findings:                 The perianal and digital rectal examinations were                            normal. Pertinent negatives include normal                            sphincter tone and no palpable rectal lesions.                           A 2 mm polyp was found in the sigmoid colon. The  polyp was sessile. The polyp was removed with a                            cold snare. Resection and retrieval were complete.                            Estimated blood loss was minimal.                           The exam was otherwise normal throughout the                            examined colon.                           The retroflexed view of the distal rectum and anal                            verge was normal and showed no anal or rectal                            abnormalities. Complications:            No immediate complications. Estimated Blood Loss:     Estimated blood loss was minimal. Impression:               - One 2 mm polyp in the sigmoid colon, removed with                            a cold snare. Resected and retrieved.                           - The distal rectum and anal verge are normal on                             retroflexion view. Recommendation:           - Patient has a contact number available for                            emergencies. The signs and symptoms of potential                            delayed complications were discussed with the                            patient. Return to normal activities tomorrow.                            Written discharge instructions were provided to the                            patient.                           - Resume previous diet.                           -  Continue present medications.                           - Await pathology results.                           - Repeat colonoscopy (date not yet determined) for                            surveillance based on pathology results. Braison Snoke E. Tomasa Rand, MD 02/05/2023 9:56:02 AM This report has been signed electronically.

## 2023-02-05 NOTE — Progress Notes (Signed)
Called to room to assist during endoscopic procedure.  Patient ID and intended procedure confirmed with present staff. Received instructions for my participation in the procedure from the performing physician.  

## 2023-02-05 NOTE — Progress Notes (Signed)
VS by CW  Pt's states no medical or surgical changes since previsit or office visit.  

## 2023-02-05 NOTE — Patient Instructions (Signed)
Please read handouts provided. Continue present medications. Await pathology results.   YOU HAD AN ENDOSCOPIC PROCEDURE TODAY AT THE [facility]:   Refer to the procedure report that was given to you for any specific questions about what was found during the examination.  If the procedure report does not answer your questions, please call your gastroenterologist to clarify.  If you requested that your care partner not be given the details of your procedure findings, then the procedure report has been included in a sealed envelope for you to review at your convenience later.  YOU SHOULD EXPECT: Some feelings of bloating in the abdomen. Passage of more gas than usual.  Walking can help get rid of the air that was put into your GI tract during the procedure and reduce the bloating. If you had a lower endoscopy (such as a colonoscopy or flexible sigmoidoscopy) you may notice spotting of blood in your stool or on the toilet paper. If you underwent a bowel prep for your procedure, you may not have a normal bowel movement for a few days.  Please Note:  You might notice some irritation and congestion in your nose or some drainage.  This is from the oxygen used during your procedure.  There is no need for concern and it should clear up in a day or so.  SYMPTOMS TO REPORT IMMEDIATELY:  Following lower endoscopy (colonoscopy or flexible sigmoidoscopy):  Excessive amounts of blood in the stool  Significant tenderness or worsening of abdominal pains  Swelling of the abdomen that is new, acute  Fever of 100F or higher  For urgent or emergent issues, a gastroenterologist can be reached at any hour by calling [phone]. Do not use MyChart messaging for urgent concerns.    DIET:  We do recommend a small meal at first, but then you may proceed to your regular diet.  Drink plenty of fluids but you should avoid alcoholic beverages for 24 hours.  ACTIVITY:  You should plan to take it easy for  the rest of today and you should NOT DRIVE or use heavy machinery until tomorrow (because of the sedation medicines used during the test).    FOLLOW UP: Our staff will call the number listed on your records the next business day following your procedure.  We will call around 7:15- 8:00 am to check on you and address any questions or concerns that you may have regarding the information given to you following your procedure. If we do not reach you, we will leave a message.     If any biopsies were taken you will be contacted by phone or by letter within the next 1-3 weeks.  Please call us at [phone] if you have not heard about the biopsies in 3 weeks.    SIGNATURES/CONFIDENTIALITY: You and/or your care partner have signed paperwork which will be entered into your electronic medical record.  These signatures attest to the fact that that the information above on your After Visit Summary has been reviewed and is understood.  Full responsibility of the confidentiality of this discharge information lies with you and/or your care-partner. 

## 2023-02-06 ENCOUNTER — Telehealth: Payer: Self-pay | Admitting: *Deleted

## 2023-02-06 NOTE — Telephone Encounter (Signed)
  Follow up Call-     02/05/2023    8:48 AM  Call back number  Post procedure Call Back phone  # [phone]  Permission to leave phone message Yes     Patient questions:  Do you have a fever, pain , or abdominal swelling? No. Pain Score  0 *  Have you tolerated food without any problems? Yes.    Have you been able to return to your normal activities? Yes.    Do you have any questions about your discharge instructions: Diet   No. Medications  No. Follow up visit  No.  Do you have questions or concerns about your Care? No.  Actions: * If pain score is 4 or above: No action needed, pain <4.

## 2023-02-15 NOTE — Progress Notes (Signed)
Tony Waller,  Good news: the polyp that I removed during your recent examination was NOT precancerous.  You should continue to follow current colorectal cancer screening guidelines with a repeat colonoscopy in 10 years.    If you develop any new rectal bleeding, abdominal pain or significant bowel habit changes, please contact me before then.

## 2023-03-01 ENCOUNTER — Other Ambulatory Visit: Payer: Self-pay | Admitting: Podiatry

## 2023-03-01 ENCOUNTER — Other Ambulatory Visit: Payer: Self-pay | Admitting: Medical

## 2023-04-01 ENCOUNTER — Ambulatory Visit (INDEPENDENT_AMBULATORY_CARE_PROVIDER_SITE_OTHER): Payer: BC Managed Care – PPO | Admitting: Medical

## 2023-04-01 ENCOUNTER — Encounter: Payer: Self-pay | Admitting: Medical

## 2023-04-01 VITALS — BP 110/68 | HR 70 | Wt 297.2 lb

## 2023-04-01 DIAGNOSIS — E569 Vitamin deficiency, unspecified: Secondary | ICD-10-CM | POA: Diagnosis not present

## 2023-04-01 DIAGNOSIS — Z6838 Body mass index (BMI) 38.0-38.9, adult: Secondary | ICD-10-CM | POA: Diagnosis not present

## 2023-04-01 DIAGNOSIS — R7301 Impaired fasting glucose: Secondary | ICD-10-CM

## 2023-04-01 DIAGNOSIS — I10 Essential (primary) hypertension: Secondary | ICD-10-CM

## 2023-04-01 MED ORDER — ZEPBOUND 2.5 MG/0.5ML ~~LOC~~ SOAJ: 2.5000 mg | SUBCUTANEOUS | 0 refills | Status: DC

## 2023-04-01 MED ORDER — ZEPBOUND 5 MG/0.5ML ~~LOC~~ SOAJ: 5.0000 mg | SUBCUTANEOUS | 0 refills | Status: DC

## 2023-04-01 NOTE — Progress Notes (Signed)
Subjective:  Tony Waller is a 52 y.o. male who presents for Chief Complaint  Patient presents with   Medical Management of Chronic Issues    Med check- discuss weight loss     Here to discuss weight loss medicine and to help get weight under control.  He has been trying hard to maintain weight but has still gained some weight despite his efforts  Current exercise: Been working with a trainer, 2 to 3 days/week for several months now  Dietary efforts: Low-carb, only eats bread or potatoes 2 times per week, trying to eat healthy but on the weekends has a bit trouble keeping on diet.  He does drink some alcohol on the weekends and this may be part of his weakness  Medication: Prior has used Qsymia phentermine and prescription for [facility].  He did not see a lot of benefit on the regular dose of Qsymia but has seen benefit with phentermine in the past.  Any side effects of medication: None   No other aggravating or relieving factors.    No other c/o.  Past Medical History:  Diagnosis Date   ALLERGIC RHINITIS    Arthralgia    knees, shoulder   Arthritis    OSA (obstructive sleep apnea) 2011   on CPAP most nights   Wears contact lenses     Current Outpatient Medications on File Prior to Visit  Medication Sig Dispense Refill   acyclovir (ZOVIRAX) 400 MG tablet TAKE 1 TABLET BY MOUTH TWICE A DAY 180 tablet 0   amLODipine (NORVASC) 5 MG tablet TAKE 1 TABLET (5 MG TOTAL) BY MOUTH DAILY. 90 tablet 0   ibuprofen (ADVIL) 800 MG tablet Take 1 tablet (800 mg total) by mouth every 8 (eight) hours as needed. 60 tablet 2   Vitamin D, Ergocalciferol, (DRISDOL) 1.25 MG (50000 UNIT) CAPS capsule Take 1 capsule (50,000 Units total) by mouth every 7 (seven) days. 12 capsule 3   No current facility-administered medications on file prior to visit.    The following portions of the patient's history were reviewed and updated as appropriate: allergies, current medications, past family history, past  medical history, past social history, past surgical history and problem list.  ROS Otherwise as in subjective above   Objective: BP 110/68   Pulse 70   Wt 297 lb 3.2 oz (134.8 kg)   BMI 38.68 kg/m   General appearance: alert, no distress, well developed, well nourished    Assessment: Encounter Diagnoses  Name Primary?   BMI 38.0-38.9,adult Yes   Impaired fasting blood sugar    Essential hypertension, benign    Vitamin deficiency      Plan: We discussed medication options.  Begin trial of Zepbound.  Discussed risk and benefits and proper use of medication.  He prior has had success with phentermine but not regular dose of Qsymia.  He never tried a higher dose of Qsymia last year.  Continue with working with a trainer for exercise which she is doing well.  We counseled on diet and strategies to help with the weekends.  Consider cutting out alcohol altogether.  Coming here for physical.  Follow-up in 4 to 6 weeks on medication  Tony Waller was seen today for medical management of chronic issues.  Diagnoses and all orders for this visit:  BMI 38.0-38.9,adult  Impaired fasting blood sugar  Essential hypertension, benign  Vitamin deficiency  Other orders -     tirzepatide (ZEPBOUND) 2.5 MG/0.5ML Pen; Inject 2.5 mg into the skin once  a week. -     tirzepatide (ZEPBOUND) 5 MG/0.5ML Pen; Inject 5 mg into the skin once a week.    Follow up: 4-6 weeks

## 2023-04-04 ENCOUNTER — Telehealth: Payer: Self-pay | Admitting: Medical

## 2023-04-06 NOTE — Telephone Encounter (Signed)
Medical records sent to plan for P.A.

## 2023-04-07 NOTE — Telephone Encounter (Signed)
Notified pt via phone. Approval faxed to [facility] f: [phone].

## 2023-04-07 NOTE — Telephone Encounter (Signed)
PA Case ID #: [id number] Rx #: [id number] Outcome: Approved on August 23 by [facility] [employer] [facility] ([facility] 2017 Approved. Authorization Expiration Date: 08/05/2023 Drug: Zepbound 2.5MG /0.5ML pen-injectors Form: [employer]  [employer] Form

## 2023-04-11 NOTE — Telephone Encounter (Signed)
DONE

## 2023-04-24 ENCOUNTER — Other Ambulatory Visit: Payer: Self-pay | Admitting: Medical

## 2023-04-24 MED ORDER — SILDENAFIL CITRATE 100 MG PO TABS: ORAL_TABLET | ORAL | 0 refills | Status: DC

## 2023-05-18 ENCOUNTER — Encounter: Payer: Self-pay | Admitting: Medical

## 2023-05-18 ENCOUNTER — Ambulatory Visit (INDEPENDENT_AMBULATORY_CARE_PROVIDER_SITE_OTHER): Payer: BC Managed Care – PPO | Admitting: Medical

## 2023-05-18 VITALS — BP 140/100 | HR 81 | Ht 72.0 in | Wt 289.6 lb

## 2023-05-18 DIAGNOSIS — R7301 Impaired fasting glucose: Secondary | ICD-10-CM | POA: Diagnosis not present

## 2023-05-18 DIAGNOSIS — Z23 Encounter for immunization: Secondary | ICD-10-CM

## 2023-05-18 DIAGNOSIS — N400 Enlarged prostate without lower urinary tract symptoms: Secondary | ICD-10-CM | POA: Diagnosis not present

## 2023-05-18 DIAGNOSIS — Z131 Encounter for screening for diabetes mellitus: Secondary | ICD-10-CM

## 2023-05-18 DIAGNOSIS — I10 Essential (primary) hypertension: Secondary | ICD-10-CM | POA: Diagnosis not present

## 2023-05-18 DIAGNOSIS — E559 Vitamin D deficiency, unspecified: Secondary | ICD-10-CM | POA: Insufficient documentation

## 2023-05-18 DIAGNOSIS — G4733 Obstructive sleep apnea (adult) (pediatric): Secondary | ICD-10-CM | POA: Diagnosis not present

## 2023-05-18 DIAGNOSIS — Z0000 Encounter for general adult medical examination without abnormal findings: Secondary | ICD-10-CM | POA: Diagnosis not present

## 2023-05-18 DIAGNOSIS — Z125 Encounter for screening for malignant neoplasm of prostate: Secondary | ICD-10-CM

## 2023-05-18 DIAGNOSIS — Z7185 Encounter for immunization safety counseling: Secondary | ICD-10-CM | POA: Diagnosis not present

## 2023-05-18 LAB — POCT URINALYSIS DIP (PROADVANTAGE DEVICE)
Bilirubin, UA: NEGATIVE
Blood, UA: NEGATIVE
Glucose, UA: NEGATIVE mg/dL
Leukocytes, UA: NEGATIVE
Nitrite, UA: NEGATIVE
Protein Ur, POC: NEGATIVE mg/dL
Specific Gravity, Urine: 1.025
Urobilinogen, Ur: NEGATIVE
pH, UA: 6 (ref 5.0–8.0)

## 2023-05-18 NOTE — Progress Notes (Signed)
Subjective:   HPI  Tony Waller is a 52 y.o. male who presents for Chief Complaint  Patient presents with   Annual Exam    Fasting cpe, no concerns,  flu and covid     Patient Care Team: Margarito Dehaas, Cleda Mccreedy as PCP - General (Family Medicine) Dr. Gershon Mussel, ortho Dr. Kemper Durie, podiatry Dr. Tiajuana Amass, GI   Concerns: He notes that he quit his blood pressure medicine few months ago this is blood pressure looking good.  Compliant with the starter dose of Zepbound without complaints   Reviewed their medical, surgical, family, social, medication, and allergy history and updated chart as appropriate.  Allergies  Allergen Reactions   Penicillins Hives    Past Medical History:  Diagnosis Date   ALLERGIC RHINITIS    Arthralgia    knees, shoulder   Arthritis    OSA (obstructive sleep apnea) 2011   on CPAP most nights   Wears contact lenses     Current Outpatient Medications on File Prior to Visit  Medication Sig Dispense Refill   acyclovir (ZOVIRAX) 400 MG tablet TAKE 1 TABLET BY MOUTH TWICE A DAY 180 tablet 0   ibuprofen (ADVIL) 800 MG tablet Take 1 tablet (800 mg total) by mouth every 8 (eight) hours as needed. 60 tablet 2   sildenafil (VIAGRA) 100 MG tablet 1/2 to 1 tablet daily as needed.  Max 1 tablet per 24 hours.  Take 30 to 45 minutes before sexual activity 10 tablet 0   tirzepatide (ZEPBOUND) 2.5 MG/0.5ML Pen Inject 2.5 mg into the skin once a week. 2 mL 0   Vitamin D, Ergocalciferol, (DRISDOL) 1.25 MG (50000 UNIT) CAPS capsule Take 1 capsule (50,000 Units total) by mouth every 7 (seven) days. 12 capsule 3   amLODipine (NORVASC) 5 MG tablet TAKE 1 TABLET (5 MG TOTAL) BY MOUTH DAILY. (Patient not taking: Reported on 05/18/2023) 90 tablet 0   tirzepatide (ZEPBOUND) 5 MG/0.5ML Pen Inject 5 mg into the skin once a week. (Patient not taking: Reported on 05/18/2023) 2 mL 0   No current facility-administered medications on file prior to visit.      Current  Outpatient Medications:    acyclovir (ZOVIRAX) 400 MG tablet, TAKE 1 TABLET BY MOUTH TWICE A DAY, Disp: 180 tablet, Rfl: 0   ibuprofen (ADVIL) 800 MG tablet, Take 1 tablet (800 mg total) by mouth every 8 (eight) hours as needed., Disp: 60 tablet, Rfl: 2   sildenafil (VIAGRA) 100 MG tablet, 1/2 to 1 tablet daily as needed.  Max 1 tablet per 24 hours.  Take 30 to 45 minutes before sexual activity, Disp: 10 tablet, Rfl: 0   tirzepatide (ZEPBOUND) 2.5 MG/0.5ML Pen, Inject 2.5 mg into the skin once a week., Disp: 2 mL, Rfl: 0   Vitamin D, Ergocalciferol, (DRISDOL) 1.25 MG (50000 UNIT) CAPS capsule, Take 1 capsule (50,000 Units total) by mouth every 7 (seven) days., Disp: 12 capsule, Rfl: 3   amLODipine (NORVASC) 5 MG tablet, TAKE 1 TABLET (5 MG TOTAL) BY MOUTH DAILY. (Patient not taking: Reported on 05/18/2023), Disp: 90 tablet, Rfl: 0   tirzepatide (ZEPBOUND) 5 MG/0.5ML Pen, Inject 5 mg into the skin once a week. (Patient not taking: Reported on 05/18/2023), Disp: 2 mL, Rfl: 0  Family History  Problem Relation Age of Onset   Allergies Mother    Diabetes Mother    Arthritis Mother    Hyperlipidemia Father    Hypertension Father    Arthritis Brother  Brain cancer Maternal Uncle    Stroke Maternal Grandmother    Heart disease Maternal Grandfather    Heart disease Paternal Grandmother    Lung cancer Paternal Grandfather    Cancer Paternal Grandfather    Colon cancer Neg Hx    Colon polyps Neg Hx    Rectal cancer Neg Hx    Stomach cancer Neg Hx    Esophageal cancer Neg Hx     Past Surgical History:  Procedure Laterality Date   BUNIONECTOMY Left    KNEE ARTHROSCOPY WITH SUBCHONDROPLASTY Left 06/22/2019   Procedure: LEFT KNEE ARTHROSCOPY WITH PARTIAL MEDIAL MENISCECTOMY, SUBCHONDROPLASTY AND EXCISION OF CYST;  Surgeon: Tarry Kos, MD;  Location: [facility];  Service: Orthopedics;  Laterality: Left;   NASAL SINUS SURGERY  2000   for deviated septum and turbonate reduction    ROTATOR CUFF REPAIR     right   TONSILLECTOMY  1994     Review of Systems  Constitutional:  Negative for chills, fever, malaise/fatigue and weight loss.  HENT:  Negative for congestion, ear pain, hearing loss, sore throat and tinnitus.   Eyes:  Negative for blurred vision, pain and redness.  Respiratory:  Negative for cough, hemoptysis and shortness of breath.   Cardiovascular:  Negative for chest pain, palpitations, orthopnea, claudication and leg swelling.  Gastrointestinal:  Negative for abdominal pain, blood in stool, constipation, diarrhea, nausea and vomiting.  Genitourinary:  Negative for dysuria, flank pain, frequency, hematuria and urgency.       Nocturia   Musculoskeletal:  Negative for falls, joint pain and myalgias.  Skin:  Negative for itching and rash.  Neurological:  Negative for dizziness, tingling, speech change, weakness and headaches.  Endo/Heme/Allergies:  Negative for polydipsia. Does not bruise/bleed easily.  Psychiatric/Behavioral:  Negative for depression and memory loss. The patient is not nervous/anxious and does not have insomnia.        Objective:  BP (!) 140/100   Pulse 81   Ht 6' (1.829 m)   Wt 289 lb 9.6 oz (131.4 kg)   BMI 39.28 kg/m   General appearance: alert, no distress, WD/WN, African American male Skin: tattoos bilat upper lateral arms HEENT: normocephalic, conjunctiva/corneas normal, sclerae anicteric, PERRLA, EOMi, nares patent, no discharge or erythema, pharynx normal Oral cavity: MMM, tongue normal, teeth normal Neck: supple, no lymphadenopathy, no thyromegaly, no masses, normal ROM, no bruits Chest: non tender, normal shape and expansion Heart: RRR, normal S1, S2, no murmurs Lungs: CTA bilaterally, no wheezes, rhonchi, or rales Abdomen: +bs, soft, non tender, non distended, no masses, no hepatomegaly, no splenomegaly, no bruits Back: non tender, normal ROM, no scoliosis Musculoskeletal: upper extremities non tender, no obvious  deformity, normal ROM throughout, lower extremities non tender, no obvious deformity, normal ROM throughout Extremities: no edema, no cyanosis, no clubbing Pulses: 2+ symmetric, upper and lower extremities, normal cap refill Neurological: alert, oriented x 3, CN2-12 intact, strength normal upper extremities and lower extremities, sensation normal throughout, DTRs 2+ throughout, no cerebellar signs, gait normal Psychiatric: normal affect, behavior normal, pleasant  GU: normal male external genitalia,circumcised, nontender, no masses, no hernia, no lymphadenopathy Rectal: anus normal tone, enlarged prostate, no nodules    Assessment and Plan :   Encounter Diagnoses  Name Primary?   Encounter for health maintenance examination in adult Yes   Essential hypertension, benign    Impaired fasting blood sugar    OSA (obstructive sleep apnea)    Prostate enlargement    Vaccine counseling  Screening for prostate cancer    Screening for diabetes mellitus    Vitamin D deficiency    Need for COVID-19 vaccine    Need for influenza vaccination     This visit was a preventative care visit, also known as wellness visit or routine physical.   Topics typically include healthy lifestyle, diet, exercise, preventative care, vaccinations, sick and well care, proper use of emergency dept and after hours care, as well as other concerns.     Separate significant issues discussed: Hypertension-restart blood pressure medication.  He was on amlodipine.  We also discussed other possibilities such as Hytrin or other medicine that can help treat BPH and hypertension.  Follow-up pending labs  OSA-sometimes compliant with CPAP.  Advise he contact home health about trying a different mask since he does not always tolerate the mask  BPH-discussed possible treatments as he does have some nocturia symptoms   Impaired glucose-updated labs today  Obesity-continue efforts to lose weight through healthy diet and  exercise and medication Zepbound.  Go ahead and go up to the 5 mg dose     General Recommendations: Continue to return yearly for your annual wellness and preventative care visits.  This gives [place] a chance to discuss healthy lifestyle, exercise, vaccinations, review your chart record, and perform screenings where appropriate.  I recommend you see your eye doctor yearly for routine vision care.  I recommend you see your dentist yearly for routine dental care including hygiene visits twice yearly.   Vaccination  Immunization History  Administered Date(s) Administered   Influenza Whole 05/11/2009   Influenza, Seasonal, Injecte, Preservative Fre 05/18/2023   Influenza,inj,Quad PF,6+ Mos 06/02/2018, 05/03/2019, 07/01/2021   PFIZER(Purple Top)SARS-COV-2 Vaccination 10/11/2019, 11/08/2019, 06/25/2020   Pfizer Covid-19 Vaccine Bivalent Booster 80yrs & up 07/01/2021   Pfizer(Comirnaty)Fall Seasonal Vaccine 12 years and older 05/18/2023   Tdap 06/16/2014, 07/11/2017    Counseled on the influenza virus vaccine.  Vaccine information sheet given.  Influenza vaccine given after consent obtained.  Counseled on the Covid virus vaccine.  Vaccine information sheet given.  Covid vaccine given after consent obtained.   Screening for cancer: Colon cancer screening: Prior or last colon cancer screen: 01/2023 colonoscopy reviewed, repeat 2034   Prostate Cancer screening: The recommended prostate cancer screening test is a blood test called the prostate-specific antigen (PSA) test. PSA is a protein that is made in the prostate. As you age, your prostate naturally produces more PSA. Abnormally high PSA levels may be caused by: Prostate cancer. An enlarged prostate that is not caused by cancer (benign prostatic hyperplasia, or BPH). This condition is very common in older men. A prostate gland infection (prostatitis) or urinary tract infection. Certain medicines such as male hormones (like testosterone)  or other medicines that raise testosterone levels. A rectal exam may be done as part of prostate cancer screening to help provide information about the size of your prostate gland. When a rectal exam is performed, it should be done after the PSA level is drawn to avoid any effect on the results.   Skin cancer screening: Check your skin regularly for new changes, growing lesions, or other lesions of concern Come in for evaluation if you have skin lesions of concern.   Lung cancer screening: If you have a greater than 20 pack year history of tobacco use, then you may qualify for lung cancer screening with a chest CT scan.   Please call your insurance company to inquire about coverage for this test.  Pancreatic cancer:  no current screening test is available or routinely recommended. (risk factors: smoking, overweight or obese, diabetes, chronic pancreatitis, work exposure - dry cleaning, metal working, 50yo>, M>F, Tree surgeon, family hx/o, hereditary breast, ovarian, melanoma, lynch, peutz-jeghers).  Symptoms: jaundice, dark urine, light color or greasy stools, itchy skin, belly or back pain, weight loss, poor appetite, nausea, vomiting, liver enlargement, DVT/blood clots.   We currently don't have screenings for other cancers besides breast, cervical, colon, and lung cancers.  If you have a strong family history of cancer or have other cancer screening concerns, please let me know.  Genetic testing referral is an option for individuals with high cancer risk in the family.  There are some other cancer screenings in development currently.   Bone health: Get at least 150 minutes of aerobic exercise weekly Get weight bearing exercise at least once weekly Bone density test:  A bone density test is an imaging test that uses a type of X-ray to measure the amount of calcium and other minerals in your bones. The test may be used to diagnose or screen you for a condition that causes weak or thin  bones (osteoporosis), predict your risk for a broken bone (fracture), or determine how well your osteoporosis treatment is working. The bone density test is recommended for females 65 and older, or females or males <65 if certain risk factors such as thyroid disease, long term use of steroids such as for asthma or rheumatological issues, vitamin D deficiency, estrogen deficiency, family history of osteoporosis, self or family history of fragility fracture in first degree relative.    Heart health: Get at least 150 minutes of aerobic exercise weekly Limit alcohol It is important to maintain a healthy blood pressure and healthy cholesterol numbers  Heart disease screening: Screening for heart disease includes screening for blood pressure, fasting lipids, glucose/diabetes screening, BMI height to weight ratio, reviewed of smoking status, physical activity, and diet.    Goals include blood pressure 120/80 or less, maintaining a healthy lipid/cholesterol profile, preventing diabetes or keeping diabetes numbers under good control, not smoking or using tobacco products, exercising most days per week or at least 150 minutes per week of exercise, and eating healthy variety of fruits and vegetables, healthy oils, and avoiding unhealthy food choices like fried food, fast food, high sugar and high cholesterol foods.    Other tests may possibly include EKG test, CT coronary calcium score, echocardiogram, exercise treadmill stress test.       Vascular disease screening: For higher risk individuals including smokers, diabetics, patients with known heart disease or high blood pressure, kidney disease, and others, screening for vascular disease or atherosclerosis of the arteries is available.  Examples may include carotid ultrasound, abdominal aortic ultrasound, ABI blood flow screening in the legs, thoracic aorta screening.    Medical care options: I recommend you continue to seek care here first for  routine care.  We try really hard to have available appointments Monday through Friday daytime hours for sick visits, acute visits, and physicals.  Urgent care should be used for after hours and weekends for significant issues that cannot wait till the next day.  The emergency department should be used for significant potentially life-threatening emergencies.  The emergency department is expensive, can often have long wait times for less significant concerns, so try to utilize primary care, urgent care, or telemedicine when possible to avoid unnecessary trips to the emergency department.  Virtual visits and telemedicine have been introduced since the  pandemic started in 2020, and can be convenient ways to receive medical care.  We offer virtual appointments as well to assist you in a variety of options to seek medical care.   Legal  Take the time to do a last will and testament, Advanced Directives including Health Care Power of Attorney and Living Will documents.  Don't leave your family with burdens that can be handled ahead of time.   Advanced Directives: I recommend you consider completing a Health Care Power of Attorney and Living Will.   These documents respect your wishes and help alleviate burdens on your loved ones if you were to become terminally ill or be in a position to need those documents enforced.    You can complete Advanced Directives yourself, have them notarized, then have copies made for our office, for you and for anybody you feel should have them in safe keeping.  Or, you can have an attorney prepare these documents.   If you haven't updated your Last Will and Testament in a while, it may be worthwhile having an attorney prepare these documents together and save on some costs.       Spiritual and Emotional Health Keeping a healthy spiritual life can help you better manage your physical health. Your spiritual life can help you to cope with any issues that may arise with your  physical health.  Balance can keep [place] healthy and help [place] to recover.  If you are struggling with your spiritual health there are questions that you may want to ask yourself:  What makes me feel most complete? When do I feel most connected to the rest of the world? Where do I find the most inner strength? What am I doing when I feel whole?  Helpful tips: Being in nature. Some people feel very connected and at peace when they are walking outdoors or are outside. Helping others. Some feel the largest sense of wellbeing when they are of service to others. Being of service can take on many forms. It can be doing volunteer work, being kind to strangers, or offering a hand to a friend in need. Gratitude. Some people find they feel the most connected when they remain grateful. They may make lists of all the things they are grateful for or say a thank you out loud for all they have.    Emotional Health Are you in tune with your emotional health?  Check out this link: http://www.marquez-love.com/    Financial Health Make sure you use a budget for your personal finances Make sure you are insured against risks (health insurance, life insurance, auto insurance, etc) Save more, spend less Set financial goals If you need help in this area, good resources include counseling through [employer] or other community resources, have a meeting with a Social research officer, government, and a good resource is the [employer]    Darris was seen today for annual exam.  Diagnoses and all orders for this visit:  Encounter for health maintenance examination in adult -     Comprehensive metabolic panel -     CBC -     Lipid panel -     PSA -     POCT Urinalysis DIP (Proadvantage Device) -     TSH -     Hemoglobin A1c -     HIV Antibody (routine testing w rflx) -     Hepatitis C antibody  Essential hypertension, benign  Impaired fasting blood sugar -  Hemoglobin A1c  OSA  (obstructive sleep apnea)  Prostate enlargement -     PSA  Vaccine counseling -     Pfizer Comirnaty Covid -19 Vaccine 9yrs and older -     Flu vaccine trivalent PF, 6mos and older(Flulaval,Afluria,Fluarix,Fluzone)  Screening for prostate cancer -     PSA  Screening for diabetes mellitus -     Hemoglobin A1c  Vitamin D deficiency  Need for COVID-19 vaccine  Need for influenza vaccination     Follow-up pending labs, yearly for physical

## 2023-05-19 ENCOUNTER — Other Ambulatory Visit: Payer: Self-pay | Admitting: Medical

## 2023-05-19 LAB — COMPREHENSIVE METABOLIC PANEL
ALT: 26 IU/L (ref 0–44)
AST: 28 IU/L (ref 0–40)
Albumin: 4.4 g/dL (ref 4.1–5.1)
Alkaline Phosphatase: 77 IU/L (ref 44–121)
BUN/Creatinine Ratio: 16 (ref 9–20)
BUN: 17 mg/dL (ref 6–24)
Bilirubin Total: 0.5 mg/dL (ref 0.0–1.2)
CO2: 24 mmol/L (ref 20–29)
Calcium: 11.1 mg/dL — ABNORMAL HIGH (ref 8.7–10.2)
Chloride: 103 mmol/L (ref 96–106)
Creatinine, Ser: 1.05 mg/dL (ref 0.76–1.27)
Globulin, Total: 3.2 g/dL (ref 1.5–4.5)
Glucose: 83 mg/dL (ref 70–99)
Potassium: 4.5 mmol/L (ref 3.5–5.2)
Sodium: 139 mmol/L (ref 134–144)
Total Protein: 7.6 g/dL (ref 6.0–8.5)
eGFR: 86 mL/min/{1.73_m2} (ref >59)

## 2023-05-19 LAB — LIPID PANEL
Cholesterol, Total: 222 mg/dL — ABNORMAL HIGH (ref 100–199)
HDL: 88 mg/dL (ref >39)
LDL CALC COMMENT:: 2.5 ratio (ref 0.0–5.0)
LDL Chol Calc (NIH): 122 mg/dL — ABNORMAL HIGH (ref 0–99)
Triglycerides: 70 mg/dL (ref 0–149)
VLDL Cholesterol Cal: 12 mg/dL (ref 5–40)

## 2023-05-19 LAB — HEPATITIS C ANTIBODY

## 2023-05-19 LAB — CBC
Hematocrit: 46.5 % (ref 37.5–51.0)
Hemoglobin: 15.3 g/dL (ref 13.0–17.7)
MCH: 30.4 pg (ref 26.6–33.0)
MCHC: 32.9 g/dL (ref 31.5–35.7)
MCV: 92 fL (ref 79–97)
Platelets: 316 10*3/uL (ref 150–450)
RBC: 5.04 x10E6/uL (ref 4.14–5.80)
RDW: 12.9 % (ref 11.6–15.4)
WBC: 4.8 10*3/uL (ref 3.4–10.8)

## 2023-05-19 LAB — PSA: Prostate Specific Ag, Serum: 2.2 ng/mL (ref 0.0–4.0)

## 2023-05-19 LAB — HIV ANTIBODY (ROUTINE TESTING W REFLEX)

## 2023-05-19 LAB — HEMOGLOBIN A1C
Est. average glucose Bld gHb Est-mCnc: 117 mg/dL
Hgb A1c MFr Bld: 5.7 % — ABNORMAL HIGH (ref 4.8–5.6)

## 2023-05-19 LAB — TSH: TSH: 3.49 u[IU]/mL (ref 0.450–4.500)

## 2023-05-19 MED ORDER — AMLODIPINE BESYLATE 5 MG PO TABS: 5.0000 mg | ORAL_TABLET | Freq: Every day | ORAL | 0 refills | Status: DC

## 2023-05-19 NOTE — Telephone Encounter (Signed)
Dose has been increased 

## 2023-05-19 NOTE — Progress Notes (Signed)
Results sent through MyChart

## 2023-05-20 ENCOUNTER — Encounter: Payer: Self-pay | Admitting: Orthopaedic Surgery

## 2023-05-20 ENCOUNTER — Other Ambulatory Visit: Payer: Self-pay

## 2023-05-20 ENCOUNTER — Ambulatory Visit (INDEPENDENT_AMBULATORY_CARE_PROVIDER_SITE_OTHER): Payer: BC Managed Care – PPO | Admitting: Sports Medicine

## 2023-05-20 ENCOUNTER — Ambulatory Visit (INDEPENDENT_AMBULATORY_CARE_PROVIDER_SITE_OTHER): Payer: BC Managed Care – PPO | Admitting: Orthopaedic Surgery

## 2023-05-20 DIAGNOSIS — M19012 Primary osteoarthritis, left shoulder: Secondary | ICD-10-CM | POA: Diagnosis not present

## 2023-05-20 DIAGNOSIS — G8929 Other chronic pain: Secondary | ICD-10-CM

## 2023-05-20 DIAGNOSIS — M25512 Pain in left shoulder: Secondary | ICD-10-CM

## 2023-05-20 DIAGNOSIS — M170 Bilateral primary osteoarthritis of knee: Secondary | ICD-10-CM | POA: Diagnosis not present

## 2023-05-20 MED ORDER — METHYLPREDNISOLONE ACETATE 40 MG/ML IJ SUSP
40.0000 mg | INTRAMUSCULAR | Status: AC | PRN
Start: 2023-05-20 — End: 2023-05-20
  Administered 2023-05-20: 40 mg via INTRA_ARTICULAR

## 2023-05-20 MED ORDER — LIDOCAINE HCL 1 % IJ SOLN
0.5000 mL | INTRAMUSCULAR | Status: AC | PRN
Start: 2023-05-20 — End: 2023-05-20
  Administered 2023-05-20: .5 mL

## 2023-05-20 MED ORDER — LIDOCAINE HCL 1 % IJ SOLN
2.0000 mL | INTRAMUSCULAR | Status: AC | PRN
Start: 2023-05-20 — End: 2023-05-20
  Administered 2023-05-20: 2 mL

## 2023-05-20 MED ORDER — BUPIVACAINE HCL 0.5 % IJ SOLN
2.0000 mL | INTRAMUSCULAR | Status: AC | PRN
Start: 2023-05-20 — End: 2023-05-20
  Administered 2023-05-20: 2 mL via INTRA_ARTICULAR

## 2023-05-20 NOTE — Progress Notes (Signed)
Office Visit Note   Patient: Tony Waller           Date of Birth: February 15, 1971           MRN: [id number] Visit Date: 05/20/2023              Requested by: Jac Canavan, PA-C [address] [place],  [place] [address] PCP: Jac Canavan, PA-C   Assessment & Plan: Visit Diagnoses:  1. Bilateral primary osteoarthritis of knee   2. Chronic left shoulder pain     Plan: Tony Waller is a 52 year old gentleman with advanced bilateral knee DJD and symptomatic left AC joint arthrosis.  We repeated cortisone injections for his knees today.  Tony Waller would also like to do another ultrasound-guided [facility] joint injection today.  We sent him to Dr. Shon Baton for this.  Follow-Up Instructions: No follow-ups on file.   Orders:  Orders Placed This Encounter  Procedures   AMB referral to sports medicine   No orders of the defined types were placed in this encounter.     Procedures: Large Joint Inj: bilateral knee on 05/20/2023 9:32 AM Indications: pain Details: 22 G needle  Arthrogram: No  Medications (Right): 2 mL lidocaine 1 %; 2 mL bupivacaine 0.5 %; 40 mg methylPREDNISolone acetate 40 MG/ML Medications (Left): 2 mL lidocaine 1 %; 2 mL bupivacaine 0.5 %; 40 mg methylPREDNISolone acetate 40 MG/ML Outcome: tolerated well, no immediate complications Patient was prepped and draped in the usual sterile fashion.       Clinical Data: No additional findings.   Subjective: Chief Complaint  Patient presents with   Left Knee - Pain   Left Shoulder - Pain   Right Knee - Pain    HPI [name] follows up today for bilateral knee pain and left shoulder pain.  Tony Waller had cortisone injections in both knees earlier this year about 6 months ago.  Tony Waller had a left AC joint injection last year.  Tony Waller has been working out in [employer] a lot recently. Review of Systems  Constitutional: Negative.   HENT: Negative.    Eyes: Negative.   Respiratory: Negative.    Cardiovascular: Negative.   Gastrointestinal:  Negative.   Endocrine: Negative.   Genitourinary: Negative.   Skin: Negative.   Allergic/Immunologic: Negative.   Neurological: Negative.   Hematological: Negative.   Psychiatric/Behavioral: Negative.    All other systems reviewed and are negative.    Objective: Vital Signs: There were no vitals taken for this visit.  Physical Exam Vitals and nursing note reviewed.  Constitutional:      Appearance: Tony Waller is well-developed.  Pulmonary:     Effort: Pulmonary effort is normal.  Abdominal:     Palpations: Abdomen is soft.  Skin:    General: Skin is warm.  Neurological:     Mental Status: Tony Waller is alert and oriented to person, place, and time.  Psychiatric:        Behavior: Behavior normal.        Thought Content: Thought content normal.        Judgment: Judgment normal.     Ortho Exam Exam of bilateral knees unchanged. Exam of the left shoulder shows tenderness to the [facility] joint.  Pain with cross body adduction. Specialty Comments:  No specialty comments available.  Imaging: No results found.   PMFS History: Patient Active Problem List   Diagnosis Date Noted   Vitamin D deficiency 05/18/2023   BMI 37.0-37.9, adult 02/24/2022   Essential hypertension, benign 02/24/2022  Anxiety 11/13/2021   Impaired fasting blood sugar 02/20/2021   Encounter for health maintenance examination in adult 02/15/2021   Vitamin deficiency 02/15/2021   High risk medication use 02/15/2021   Prostate enlargement 02/15/2021   Screening for heart disease 02/15/2021   Screening for diabetes mellitus 02/15/2021   Screening for prostate cancer 02/15/2021   Screening for thyroid disorder 02/15/2021   Vaccine counseling 02/15/2021   BMI 36.0-36.9,adult 11/01/2020   Osteoarthritis of both knees 11/01/2020   OSA (obstructive sleep apnea) 11/01/2020   Screen for colon cancer 11/01/2020   Arthrosis of left acromioclavicular joint 08/21/2020   Acute medial meniscus tear of left knee 04/13/2019    Stress fracture of left tibia 04/13/2019   Allergic rhinitis 05/28/2009   Past Medical History:  Diagnosis Date   ALLERGIC RHINITIS    Arthralgia    knees, shoulder   Arthritis    OSA (obstructive sleep apnea) 2011   on CPAP most nights   Wears contact lenses     Family History  Problem Relation Age of Onset   Allergies Mother    Diabetes Mother    Arthritis Mother    Hyperlipidemia Father    Hypertension Father    Arthritis Brother    Brain cancer Maternal Uncle    Stroke Maternal Grandmother    Heart disease Maternal Grandfather    Heart disease Paternal Grandmother    Lung cancer Paternal Grandfather    Cancer Paternal Grandfather    Colon cancer Neg Hx    Colon polyps Neg Hx    Rectal cancer Neg Hx    Stomach cancer Neg Hx    Esophageal cancer Neg Hx     Past Surgical History:  Procedure Laterality Date   BUNIONECTOMY Left    KNEE ARTHROSCOPY WITH SUBCHONDROPLASTY Left 06/22/2019   Procedure: LEFT KNEE ARTHROSCOPY WITH PARTIAL MEDIAL MENISCECTOMY, SUBCHONDROPLASTY AND EXCISION OF CYST;  Surgeon: Tarry Kos, MD;  Location: [facility];  Service: Orthopedics;  Laterality: Left;   NASAL SINUS SURGERY  2000   for deviated septum and turbonate reduction   ROTATOR CUFF REPAIR     right   TONSILLECTOMY  1994   Social History   Occupational History   Occupation: Psychologist, occupational: Advertising copywriter  Tobacco Use   Smoking status: Former    Types: Cigars   Smokeless tobacco: Never  Vaping Use   Vaping status: Never Used  Substance and Sexual Activity   Alcohol use: Not Currently    Comment: occas   Drug use: Never   Sexual activity: Not on file

## 2023-05-20 NOTE — Progress Notes (Signed)
Procedure Note  Patient: Tony Waller             Date of Birth: [birth year]           MRN: [id number]             Visit Date: 05/20/2023  Procedures: Visit Diagnoses:  1. Arthrosis of left acromioclavicular joint   2. Chronic left shoulder pain    Medium Joint Inj: L acromioclavicular on 05/20/2023 9:56 AM Indications: pain Details: 25 G 1.5 in needle, ultrasound-guided anterior approach Medications: 0.5 mL lidocaine 1 %; 40 mg methylPREDNISolone acetate 40 MG/ML  US-guided AC Joint injection, left shoulder After discussion on risks/benefits/indications, informed verbal consent was obtained. A timeout was then performed. The patient was seated in examination room. The area overlying the [facility] joint of the shoulder was prepped with Betadine and alcohol swab then utilizing ultrasound guidance, patient's AC joint was injected using a 25G, 1.5" needle with 0.5:1.86mL lidocaine:depomedrol of injectate via an out-of-plane, walk-down approach. Visualization of injectate flow was noted under ultrasound guidance. Patient tolerated the procedure well without immediate complications.   Procedure, treatment alternatives, risks and benefits explained, specific risks discussed. Consent was given by the patient. Immediately prior to procedure a time out was called to verify the correct patient, procedure, equipment, support staff and site/side marked as required. Patient was prepped and draped in the usual sterile fashion.    - I evaluated the patient about 5 minutes post-injection and he had improvement in pain and range of motion - follow-up with Dr. Roda Shutters as indicated; I am happy to see them as needed  Madelyn Brunner, DO Primary Care Sports Medicine Physician  [facility] - [facility]  This note was dictated using Dragon naturally speaking software and may contain errors in syntax, spelling, or content which have not been identified prior to signing this note.

## 2023-05-20 NOTE — Progress Notes (Deleted)
Office Visit Note   Patient: Tony Waller           Date of Birth: Jul 16, 1971           MRN: [id number] Visit Date: 05/20/2023              Requested by: Jac Canavan, PA-C [address] [place],  [place] [address] PCP: Jac Canavan, PA-C   Assessment & Plan: Visit Diagnoses:  1. Bilateral primary osteoarthritis of knee   2. Chronic left shoulder pain     Plan: ***  Follow-Up Instructions: No follow-ups on file.   Orders:  Orders Placed This Encounter  Procedures   AMB referral to sports medicine   No orders of the defined types were placed in this encounter.     Procedures: No procedures performed   Clinical Data: No additional findings.   Subjective: Chief Complaint  Patient presents with   Left Knee - Pain   Left Shoulder - Pain   Right Knee - Pain    HPI  Review of Systems   Objective: Vital Signs: There were no vitals taken for this visit.  Physical Exam  Ortho Exam  Specialty Comments:  No specialty comments available.  Imaging: No results found.   PMFS History: Patient Active Problem List   Diagnosis Date Noted   Vitamin D deficiency 05/18/2023   BMI 37.0-37.9, adult 02/24/2022   Essential hypertension, benign 02/24/2022   Anxiety 11/13/2021   Impaired fasting blood sugar 02/20/2021   Encounter for health maintenance examination in adult 02/15/2021   Vitamin deficiency 02/15/2021   High risk medication use 02/15/2021   Prostate enlargement 02/15/2021   Screening for heart disease 02/15/2021   Screening for diabetes mellitus 02/15/2021   Screening for prostate cancer 02/15/2021   Screening for thyroid disorder 02/15/2021   Vaccine counseling 02/15/2021   BMI 36.0-36.9,adult 11/01/2020   Osteoarthritis of both knees 11/01/2020   OSA (obstructive sleep apnea) 11/01/2020   Screen for colon cancer 11/01/2020   Arthrosis of left acromioclavicular joint 08/21/2020   Acute medial meniscus tear of left knee 04/13/2019    Stress fracture of left tibia 04/13/2019   Allergic rhinitis 05/28/2009   Past Medical History:  Diagnosis Date   ALLERGIC RHINITIS    Arthralgia    knees, shoulder   Arthritis    OSA (obstructive sleep apnea) 2011   on CPAP most nights   Wears contact lenses     Family History  Problem Relation Age of Onset   Allergies Mother    Diabetes Mother    Arthritis Mother    Hyperlipidemia Father    Hypertension Father    Arthritis Brother    Brain cancer Maternal Uncle    Stroke Maternal Grandmother    Heart disease Maternal Grandfather    Heart disease Paternal Grandmother    Lung cancer Paternal Grandfather    Cancer Paternal Grandfather    Colon cancer Neg Hx    Colon polyps Neg Hx    Rectal cancer Neg Hx    Stomach cancer Neg Hx    Esophageal cancer Neg Hx     Past Surgical History:  Procedure Laterality Date   BUNIONECTOMY Left    KNEE ARTHROSCOPY WITH SUBCHONDROPLASTY Left 06/22/2019   Procedure: LEFT KNEE ARTHROSCOPY WITH PARTIAL MEDIAL MENISCECTOMY, SUBCHONDROPLASTY AND EXCISION OF CYST;  Surgeon: Tarry Kos, MD;  Location: [facility];  Service: Orthopedics;  Laterality: Left;   NASAL SINUS SURGERY  2000  for deviated septum and turbonate reduction   ROTATOR CUFF REPAIR     right   TONSILLECTOMY  1994   Social History   Occupational History   Occupation: Psychologist, occupational: Advertising copywriter  Tobacco Use   Smoking status: Former    Types: Cigars   Smokeless tobacco: Never  Vaping Use   Vaping status: Never Used  Substance and Sexual Activity   Alcohol use: Not Currently    Comment: occas   Drug use: Never   Sexual activity: Not on file

## 2023-05-22 NOTE — Progress Notes (Signed)
 Please call as they have not reviewed my chart results

## 2023-05-27 ENCOUNTER — Other Ambulatory Visit: Payer: Self-pay | Admitting: Medical

## 2023-05-27 MED ORDER — SILDENAFIL CITRATE 100 MG PO TABS: ORAL_TABLET | ORAL | 3 refills | Status: DC

## 2023-06-05 ENCOUNTER — Other Ambulatory Visit: Payer: Self-pay | Admitting: Medical

## 2023-06-05 MED ORDER — FINASTERIDE 5 MG PO TABS: 5.0000 mg | ORAL_TABLET | Freq: Every day | ORAL | 0 refills | Status: DC

## 2023-06-26 ENCOUNTER — Other Ambulatory Visit: Payer: Self-pay | Admitting: Medical

## 2023-06-29 ENCOUNTER — Other Ambulatory Visit: Payer: Self-pay | Admitting: Medical

## 2023-06-29 MED ORDER — ZEPBOUND 7.5 MG/0.5ML ~~LOC~~ SOAJ: 7.5000 mg | SUBCUTANEOUS | 0 refills | Status: DC

## 2023-06-30 ENCOUNTER — Other Ambulatory Visit: Payer: Self-pay | Admitting: Medical

## 2023-07-28 ENCOUNTER — Encounter: Payer: Self-pay | Admitting: Internal Medicine

## 2023-07-28 ENCOUNTER — Other Ambulatory Visit (HOSPITAL_COMMUNITY): Payer: Self-pay

## 2023-07-28 ENCOUNTER — Telehealth: Payer: Self-pay

## 2023-07-28 ENCOUNTER — Other Ambulatory Visit: Payer: Self-pay | Admitting: Medical

## 2023-07-28 NOTE — Telephone Encounter (Signed)
Pharmacy Patient Advocate Encounter   Received notification from CoverMyMeds that prior authorization for Zepbound is required/requested.   Insurance verification completed.   The patient is insured through [facility] .   Per test claim: PA required; PA submitted to above mentioned insurance via [facility] Key/confirmation #/[id number] Status is pending

## 2023-07-29 ENCOUNTER — Other Ambulatory Visit: Payer: Self-pay | Admitting: Medical

## 2023-07-29 NOTE — Telephone Encounter (Signed)
Looks like pt completed this back in August

## 2023-07-31 ENCOUNTER — Other Ambulatory Visit (HOSPITAL_COMMUNITY): Payer: Self-pay

## 2023-07-31 NOTE — Telephone Encounter (Signed)
Pt is scheduled for January 2025

## 2023-07-31 NOTE — Telephone Encounter (Signed)
Hello,              I have received a request to renew the patients Authorization as it is soon to Expire on 12.25.24. However with the documentation I have used/submitted. I was told the patients ''weight'' must be more recent then the one I've submitted which was a chartnote from 10/09. Tony Waller I see you have reached out to the patient regarding this. This would just be an Fyi as the Authorization is soon to End.   Thank you,

## 2023-08-13 ENCOUNTER — Ambulatory Visit (INDEPENDENT_AMBULATORY_CARE_PROVIDER_SITE_OTHER): Payer: BC Managed Care – PPO | Admitting: Medical

## 2023-08-13 VITALS — BP 120/80 | HR 73 | Ht 73.0 in | Wt 277.4 lb

## 2023-08-13 DIAGNOSIS — M170 Bilateral primary osteoarthritis of knee: Secondary | ICD-10-CM

## 2023-08-13 DIAGNOSIS — G4733 Obstructive sleep apnea (adult) (pediatric): Secondary | ICD-10-CM | POA: Diagnosis not present

## 2023-08-13 DIAGNOSIS — I10 Essential (primary) hypertension: Secondary | ICD-10-CM | POA: Diagnosis not present

## 2023-08-13 DIAGNOSIS — R7301 Impaired fasting glucose: Secondary | ICD-10-CM

## 2023-08-13 DIAGNOSIS — Z6836 Body mass index (BMI) 36.0-36.9, adult: Secondary | ICD-10-CM | POA: Diagnosis not present

## 2023-08-13 MED ORDER — ZEPBOUND 10 MG/0.5ML ~~LOC~~ SOAJ: 10.0000 mg | SUBCUTANEOUS | 0 refills | Status: DC

## 2023-08-13 MED ORDER — ZEPBOUND 12.5 MG/0.5ML ~~LOC~~ SOAJ: 12.5000 mg | SUBCUTANEOUS | 1 refills | Status: AC

## 2023-08-13 NOTE — Patient Instructions (Signed)
 Recommendations: Go ahead and increase the Zepbound  10 mg weekly for the next month  after 1 month go up to the 12.5 mg Zepbound  Over the next month monitor your blood pressures at home.  Goal is 120/70.  If you start seeing blood pressure readings less than 105/65 you would need to stop your amlodipine  blood pressure pill. If you are seeing low readings and feel little lightheaded or dizzy you could cut back to half tablet of amlodipine  or stop it completely.  We do not want you to have low readings and feel like you are going to pass out. Continue good hydration at least to 100 ounces of water daily Continue your current exercise and healthy eating habits regimen Follow-up in 2 months before you run completely out of 12.5 mg dose.  The plan would be to going up to the highest dose 15 mg after 2 months Since some short-term goals over the next 1 to 3 months regarding fitness or other health-related goals.

## 2023-08-13 NOTE — Progress Notes (Signed)
 Subjective:   HPI  Tony Waller is a 53 y.o. male who presents for Chief Complaint  Patient presents with   Medical Management of Chronic Issues    Med check. Needs refill on Zepbound     Patient Care Team: Marguetta Windish, Alm RAMAN, PA-C as PCP - General (Family Medicine) Jerri Kay HERO, MD as Attending Physician (Orthopedic Surgery) Dr. Kay Jerri, ortho Dr. Clive Dickens, podiatry Dr. Glendia Holt, GI  Here for follow up on weight loss efforts  Current exercise: Cardio 3 days per week, some weights 2 days per week  Dietary efforts: Not hungry on zepbound , has been doing baked chicken, protein shake in morning, healthy dinner.   Not counting calories.  Medication: Zepbound  7.5mg    Any side effects of medication: none now, but at first had some constipation   No other aggravating or relieving factors.    No other c/o.  Past Medical History:  Diagnosis Date   ALLERGIC RHINITIS    Arthralgia    knees, shoulder   Arthritis    OSA (obstructive sleep apnea) 2011   on CPAP most nights   Wears contact lenses     Current Outpatient Medications on File Prior to Visit  Medication Sig Dispense Refill   acyclovir  (ZOVIRAX ) 400 MG tablet TAKE 1 TABLET BY MOUTH TWICE A DAY 180 tablet 0   amLODipine  (NORVASC ) 5 MG tablet Take 1 tablet (5 mg total) by mouth daily. 90 tablet 0   finasteride  (PROSCAR ) 5 MG tablet Take 1 tablet (5 mg total) by mouth daily. 90 tablet 0   ibuprofen  (ADVIL ) 800 MG tablet Take 1 tablet (800 mg total) by mouth every 8 (eight) hours as needed. 60 tablet 2   sildenafil  (VIAGRA ) 100 MG tablet 1/2 TO 1 TABLET DAILY AS NEEDED. MAX 1 TABLET PER 24 HOURS. TAKE 30 TO 45 MINUTES BEFORE SEXUAL ACTIVITY 6 tablet 1   tirzepatide  (ZEPBOUND ) 7.5 MG/0.5ML Pen Inject 7.5 mg into the skin once a week. 2 mL 0   No current facility-administered medications on file prior to visit.    The following portions of the patient's history were reviewed and updated as appropriate:  allergies, current medications, past family history, past medical history, past social history, past surgical history and problem list.  ROS Otherwise as in subjective above    Objective: BP 120/80   Pulse 73   Ht 6' 1 (1.854 m)   Wt 277 lb 6.4 oz (125.8 kg)   BMI 36.60 kg/m   Wt Readings from Last 3 Encounters:  08/13/23 277 lb 6.4 oz (125.8 kg)  05/18/23 289 lb 9.6 oz (131.4 kg)  04/01/23 297 lb 3.2 oz (134.8 kg)   BP Readings from Last 3 Encounters:  08/13/23 120/80  05/18/23 (!) 140/100  04/01/23 110/68   General appearance: alert, no distress, well developed, well nourished Heart: RRR, normal S1, S2, no murmurs Lungs: CTA bilaterally, no wheezes, rhonchi, or rales Ext: no edema   Assessment: Encounter Diagnoses  Name Primary?   BMI 36.0-36.9,adult Yes   Essential hypertension, benign    Osteoarthritis of both knees, unspecified osteoarthritis type    OSA (obstructive sleep apnea)    Impaired fasting blood sugar      Plan: Glad to see he has continued benefit of medication for weight loss.  We discussed overall health related issues, diagnoses.  Increase to the next higher dose of Zepbound  with continued plans to lose more weight.  Work on goals, continue with current exercise and healthy eating.  Continue current blood pressure medication however if blood pressure starts to drop he may need to discontinue amlodipine .  Patient Instructions  Recommendations: Go ahead and increase the Zepbound  10 mg weekly for the next month  after 1 month go up to the 12.5 mg Zepbound  Over the next month monitor your blood pressures at home.  Goal is 120/70.  If you start seeing blood pressure readings less than 105/65 you would need to stop your amlodipine  blood pressure pill. If you are seeing low readings and feel little lightheaded or dizzy you could cut back to half tablet of amlodipine  or stop it completely.  We do not want you to have low readings and feel like you are  going to pass out. Continue good hydration at least to 100 ounces of water daily Continue your current exercise and healthy eating habits regimen Follow-up in 2 months before you run completely out of 12.5 mg dose.  The plan would be to going up to the highest dose 15 mg after 2 months Since some short-term goals over the next 1 to 3 months regarding fitness or other health-related goals.   Tony Waller was seen today for medical management of chronic issues.  Diagnoses and all orders for this visit:  BMI 36.0-36.9,adult  Essential hypertension, benign  Osteoarthritis of both knees, unspecified osteoarthritis type  OSA (obstructive sleep apnea)  Impaired fasting blood sugar  Other orders -     tirzepatide  (ZEPBOUND ) 10 MG/0.5ML Pen; Inject 10 mg into the skin once a week. -     tirzepatide  (ZEPBOUND ) 12.5 MG/0.5ML Pen; Inject 12.5 mg into the skin once a week.    Follow up: 58mo

## 2023-08-16 ENCOUNTER — Other Ambulatory Visit: Payer: Self-pay | Admitting: Medical

## 2023-08-29 ENCOUNTER — Other Ambulatory Visit: Payer: Self-pay | Admitting: Medical

## 2023-09-26 ENCOUNTER — Other Ambulatory Visit: Payer: Self-pay | Admitting: Medical

## 2023-09-30 ENCOUNTER — Encounter: Payer: Self-pay | Admitting: Medical

## 2023-09-30 ENCOUNTER — Telehealth (INDEPENDENT_AMBULATORY_CARE_PROVIDER_SITE_OTHER): Payer: BC Managed Care – PPO | Admitting: Medical

## 2023-09-30 VITALS — Wt 275.0 lb

## 2023-09-30 DIAGNOSIS — R4586 Emotional lability: Secondary | ICD-10-CM

## 2023-09-30 DIAGNOSIS — N401 Enlarged prostate with lower urinary tract symptoms: Secondary | ICD-10-CM

## 2023-09-30 DIAGNOSIS — Z6836 Body mass index (BMI) 36.0-36.9, adult: Secondary | ICD-10-CM

## 2023-09-30 DIAGNOSIS — F419 Anxiety disorder, unspecified: Secondary | ICD-10-CM | POA: Diagnosis not present

## 2023-09-30 DIAGNOSIS — R4589 Other symptoms and signs involving emotional state: Secondary | ICD-10-CM | POA: Diagnosis not present

## 2023-09-30 DIAGNOSIS — R351 Nocturia: Secondary | ICD-10-CM

## 2023-09-30 MED ORDER — TAMSULOSIN HCL 0.4 MG PO CAPS: 0.4000 mg | ORAL_CAPSULE | Freq: Every day | ORAL | 2 refills | Status: DC

## 2023-09-30 MED ORDER — ESCITALOPRAM OXALATE 20 MG PO TABS: 20.0000 mg | ORAL_TABLET | Freq: Every day | ORAL | 2 refills | Status: DC

## 2023-09-30 MED ORDER — BUPROPION HCL ER (XL) 300 MG PO TB24: 300.0000 mg | ORAL_TABLET | Freq: Every day | ORAL | 1 refills | Status: DC

## 2023-09-30 NOTE — Progress Notes (Signed)
Subjective:     Patient ID: Tony Waller, male   DOB: [birth year], 53 y.o.   MRN: [id number]  This visit type was conducted due to national recommendations for restrictions regarding the COVID-19 Pandemic (e.g. social distancing) in an effort to limit this patient's exposure and mitigate transmission in our community.  Due to their co-morbid illnesses, this patient is at least at moderate risk for complications without adequate follow up.  This format is felt to be most appropriate for this patient at this time.    Documentation for virtual audio and video telecommunications through [place] encounter:  The patient was located at home. The provider was located in the office. The patient did consent to this visit and is aware of possible charges through their insurance for this visit.  The other persons participating in this telemedicine service were none. Time spent on call was 20 minutes and in review of previous records 20 minutes total.  This virtual service is not related to other E/M service within previous 7 days.   HPI Chief Complaint  Patient presents with   Medical Management of Chronic Issues    Med check- no concerns   Here for med check.    Here for follow-up from mood changes, depressed mood and anxiety.  He had ran out of Wellbutrin in the past month or so.  He had called in asking for dose increase.  Apparently he was taking 300 mg XL daily but started taking 2 a day.  Even at 2 a day he did not feel it was helping as much as that should have.  Since he has been out of the medication he notes that his mood has been worse of late.  He was doing relatively well on Wellbutrin, wasn't drinking as much when on the medication.  He is also still trying to lose weight.  He has gained a few pounds back since drinking more  He would like to either increase the dose of Wellbutrin or try something additionally  He is seeing a counselor some.  He also has anxiety time is worse of  late  He has been urinating still couple times at night.  The cholestyramine has made improvements but still is getting up least twice a month to urinate.  He is on this for prostate enlargement.  He does not recall being on Flomax in the past  He is still on Zepbound for weight loss.  He is employer changed a policy where he now has this to be a provider with his employer who is prescribing the Zepbound.  They made have started about the 2.5 mg dose.  He had gotten down to 269 pounds but is back up to 275.  he will increase 2 weeks from now to the 5 mg  No other aggravating or relieving factors. No other complaint.   Past Medical History:  Diagnosis Date   ALLERGIC RHINITIS    Arthralgia    knees, shoulder   Arthritis    OSA (obstructive sleep apnea) 2011   on CPAP most nights   Wears contact lenses    Current Outpatient Medications on File Prior to Visit  Medication Sig Dispense Refill   acyclovir (ZOVIRAX) 400 MG tablet TAKE 1 TABLET BY MOUTH TWICE A DAY 180 tablet 1   amLODipine (NORVASC) 5 MG tablet TAKE 1 TABLET (5 MG TOTAL) BY MOUTH DAILY. 90 tablet 2   finasteride (PROSCAR) 5 MG tablet Take 1 tablet (5 mg total) by mouth daily. 90  tablet 0   ibuprofen (ADVIL) 800 MG tablet Take 1 tablet (800 mg total) by mouth every 8 (eight) hours as needed. 60 tablet 2   sildenafil (VIAGRA) 100 MG tablet 1/2 TO 1 TABLET DAILY AS NEEDED. MAX 1 TABLET PER 24 HOURS. TAKE 30 TO 45 MINUTES BEFORE SEXUAL ACTIVITY 6 tablet 1   tirzepatide (ZEPBOUND) 2.5 MG/0.5ML injection vial Inject into the skin.     tirzepatide (ZEPBOUND) 10 MG/0.5ML Pen Inject 10 mg into the skin once a week. (Patient not taking: Reported on 09/30/2023) 2 mL 0   tirzepatide (ZEPBOUND) 12.5 MG/0.5ML Pen Inject 12.5 mg into the skin once a week. (Patient not taking: Reported on 09/30/2023) 2 mL 1   tirzepatide (ZEPBOUND) 7.5 MG/0.5ML Pen Inject 7.5 mg into the skin once a week. (Patient not taking: Reported on 09/30/2023) 2 mL 0   No  current facility-administered medications on file prior to visit.    Review of Systems As in subjective    Objective:   Physical Exam Due to coronavirus pandemic stay at home measures, patient visit was virtual and they were not examined in person.   Wt 275 lb (124.7 kg)   BMI 36.28 kg/m   Gen: wd, wn nad Pleasant, good eye contact, answers questions appropriately      Assessment:     Encounter Diagnoses  Name Primary?   Anxiety Yes   BMI 36.0-36.9,adult    Mood change    Depressed mood    Benign prostatic hyperplasia with nocturia        Plan:     Anxiety and depressed mood-we discussed his medications, his recent use of 300 mg Wellbutrin twice daily.  Advised to stick to Wellbutrin 300 mg XL daily but add trial of Lexapro.  He will start out Lexapro 20 mg, 1/2 tablet daily for the first 2 weeks then go up to 20 mg daily.  Consider continuing counseling.  Limit alcohol.  MyChart message me within the next 2 to 3 weeks to let me know how this is working  BPH with nocturia-begin Flomax trial.  If this is working much better he can either continue or discontinue finasteride.  He may not need to be on both.    BMI over 30-continue efforts with healthy diet, exercise and weight loss efforts.  He is on Zepbound through his employer currently.  He is seeing employer sponsored provider for weight loss medicine specifically.   [place] was seen today for medical management of chronic issues.  Diagnoses and all orders for this visit:  Anxiety  BMI 36.0-36.9,adult  Mood change  Depressed mood  Benign prostatic hyperplasia with nocturia  Other orders -     buPROPion (WELLBUTRIN XL) 300 MG 24 hr tablet; Take 1 tablet (300 mg total) by mouth daily. -     escitalopram (LEXAPRO) 20 MG tablet; Take 1 tablet (20 mg total) by mouth daily. -     tamsulosin (FLOMAX) 0.4 MG CAPS capsule; Take 1 capsule (0.4 mg total) by mouth daily.    F/u 2 to 3 weeks MyChart follow-up

## 2023-10-14 ENCOUNTER — Other Ambulatory Visit: Payer: Self-pay | Admitting: Medical

## 2023-10-14 MED ORDER — NALTREXONE HCL 50 MG PO TABS: 25.0000 mg | ORAL_TABLET | Freq: Every day | ORAL | 1 refills | Status: DC

## 2023-11-06 ENCOUNTER — Other Ambulatory Visit: Payer: Self-pay | Admitting: Physician Assistant

## 2023-12-22 ENCOUNTER — Ambulatory Visit: Admitting: Orthopaedic Surgery

## 2023-12-23 ENCOUNTER — Other Ambulatory Visit: Payer: Self-pay

## 2023-12-23 ENCOUNTER — Encounter: Payer: Self-pay | Admitting: Orthopaedic Surgery

## 2023-12-23 ENCOUNTER — Ambulatory Visit (INDEPENDENT_AMBULATORY_CARE_PROVIDER_SITE_OTHER): Payer: Self-pay

## 2023-12-23 ENCOUNTER — Ambulatory Visit: Admitting: Sports Medicine

## 2023-12-23 ENCOUNTER — Encounter: Payer: Self-pay | Admitting: Sports Medicine

## 2023-12-23 ENCOUNTER — Ambulatory Visit: Admitting: Orthopaedic Surgery

## 2023-12-23 DIAGNOSIS — M7062 Trochanteric bursitis, left hip: Secondary | ICD-10-CM | POA: Diagnosis not present

## 2023-12-23 DIAGNOSIS — M19012 Primary osteoarthritis, left shoulder: Secondary | ICD-10-CM

## 2023-12-23 MED ORDER — METHYLPREDNISOLONE ACETATE 40 MG/ML IJ SUSP
40.0000 mg | INTRAMUSCULAR | Status: AC | PRN
  Administered 2023-12-23: 40 mg via INTRA_ARTICULAR

## 2023-12-23 MED ORDER — LIDOCAINE HCL 1 % IJ SOLN
3.0000 mL | INTRAMUSCULAR | Status: AC | PRN
  Administered 2023-12-23: 3 mL

## 2023-12-23 MED ORDER — BUPIVACAINE HCL 0.5 % IJ SOLN
3.0000 mL | INTRAMUSCULAR | Status: AC | PRN
  Administered 2023-12-23: 3 mL via INTRA_ARTICULAR

## 2023-12-23 MED ORDER — LIDOCAINE HCL 1 % IJ SOLN
0.5000 mL | INTRAMUSCULAR | Status: AC | PRN
  Administered 2023-12-23: .5 mL

## 2023-12-23 NOTE — Progress Notes (Signed)
   Procedure Note  Patient: Tony Waller             Date of Birth: Mar 05, 1971           MRN: [id number]             Visit Date: 12/23/2023  Procedures: Visit Diagnoses:  1. Arthritis of left acromioclavicular joint    Medium Joint Inj: L acromioclavicular on 12/23/2023 10:23 AM Indications: pain Details: 25 G 1.5 in needle, ultrasound-guided anterior approach Medications: 0.5 mL lidocaine  1 %; 40 mg methylPREDNISolone  acetate 40 MG/ML  US -guided AC Joint injection, left shoulder After discussion on risks/benefits/indications, informed verbal consent was obtained. A timeout was then performed. The patient was seated in examination room. The area overlying the [facility] joint of the shoulder was prepped with Betadine and alcohol swab then utilizing ultrasound guidance, patient's AC joint was injected using a 25G, 1.5" needle with 0.5:1.51mL lidocaine :depomedrol of injectate via an out-of-plane, walk-down approach. Visualization of injectate flow was noted under ultrasound guidance. Patient tolerated the procedure well without immediate complications.   Procedure, treatment alternatives, risks and benefits explained, specific risks discussed. Consent was given by the patient. Immediately prior to procedure a time out was called to verify the correct patient, procedure, equipment, support staff and site/side marked as required. Patient was prepped and draped in the usual sterile fashion.     - patient tolerated procedure well, discussed post-injection protocol - follow-up with Dr. Christiane Cowing as indicated; I am happy to see them as needed  Shauna Del, DO Primary Care Sports Medicine Physician  [facility] - Orth[facility]  This note was dictated using Dragon naturally speaking software and may contain errors in syntax, spelling, or content which have not been identified prior to signing this note.

## 2023-12-23 NOTE — Progress Notes (Signed)
 Office Visit Note   Patient: Tony Waller           Date of Birth: 08/22/1970           MRN: [id number] Visit Date: 12/23/2023              Requested by: Claudene Crystal, PA-C [address] [place],  [place] [address] PCP: Claudene Crystal, PA-C   Assessment & Plan: Visit Diagnoses:  1. Arthritis of left acromioclavicular joint   2. Trochanteric bursitis, left hip     Plan: History of Present Illness Tony Waller is a 53 year old male with left AC joint arthritis who presents with left shoulder and left hip pain.  He experiences left shoulder pain for three weeks, localized to the [facility] joint. A cortisone injection in October provided relief for two and a half months, but pain returned after resuming weight lifting.  Left hip pain persists for a year and a half, localized over the trochanteric area without radiation. No groin pain or leg radiation is present.  Shoulder x-rays were performed today and previously.  Physical Exam MUSCULOSKELETAL: Tenderness over left AC joint. Pain with Hawkins impingement test. Tenderness over left trochanteric area. No groin pain. Pain on hip rotation.  Results RADIOLOGY Shoulder X-ray: AC joint arthritis with spurring (12/23/2023) Hip X-ray: Mild osteoarthritis hip joints (12/23/2023)  Assessment and Plan Trochanteric bursitis Left trochanteric bursitis with localized pain for 18 months. X-rays show good hip joint condition. No radicular or groin pain. - Administer trochanteric bursa injection.  AC joint arthritis Chronic left AC joint arthritis with pain exacerbated by weight lifting. Previous cortisone injection provided temporary relief. X-rays show spurring. - Coordinate with Dr. Vaughn Georges for ultrasound-guided cortisone injection in the [facility] joint if available.  Follow-Up Instructions: No follow-ups on file.   Orders:  Orders Placed This Encounter  Procedures   Large Joint Inj: L greater trochanter   XR Shoulder Left   XR Pelvis 1-2  Views   XR Lumbar Spine 2-3 Views   No orders of the defined types were placed in this encounter.     Procedures: Large Joint Inj: L greater trochanter on 12/23/2023 9:39 AM Indications: pain Details: 22 G needle Medications: 3 mL lidocaine  1 %; 3 mL bupivacaine  0.5 %; 40 mg methylPREDNISolone  acetate 40 MG/ML Outcome: tolerated well, no immediate complications Patient was prepped and draped in the usual sterile fashion.       Clinical Data: No additional findings.   Subjective: Chief Complaint  Patient presents with   Left Shoulder - Pain   Left Hip - Pain    HPI  Review of Systems  Constitutional: Negative.   HENT: Negative.    Eyes: Negative.   Respiratory: Negative.    Cardiovascular: Negative.   Gastrointestinal: Negative.   Endocrine: Negative.   Genitourinary: Negative.   Skin: Negative.   Allergic/Immunologic: Negative.   Neurological: Negative.   Hematological: Negative.   Psychiatric/Behavioral: Negative.    All other systems reviewed and are negative.    Objective: Vital Signs: There were no vitals taken for this visit.  Physical Exam Vitals and nursing note reviewed.  Constitutional:      Appearance: He is well-developed.  Pulmonary:     Effort: Pulmonary effort is normal.  Abdominal:     Palpations: Abdomen is soft.  Skin:    General: Skin is warm.  Neurological:     Mental Status: He is alert and oriented to person, place, and time.  Psychiatric:  Behavior: Behavior normal.        Thought Content: Thought content normal.        Judgment: Judgment normal.     Ortho Exam  Specialty Comments:  No specialty comments available.  Imaging: XR Lumbar Spine 2-3 Views Result Date: 12/23/2023 X-rays of the lumbar spine show significantly decreased L5-S1 intervertebral disc space and osteophytic spurring.  XR Pelvis 1-2 Views Result Date: 12/23/2023 X-rays of the pelvis show no acute or structural abnormalities.  Mild  osteoarthritic changes.  XR Shoulder Left Result Date: 12/23/2023 X-rays of the left shoulder show no acute or structural abnormalities.  Degenerative irregularity of the greater tuberosity.  Degenerative spurring on the undersurface of the acromion.    PMFS History: Patient Active Problem List   Diagnosis Date Noted   Trochanteric bursitis, left hip 12/23/2023   Vitamin D  deficiency 05/18/2023   Essential hypertension, benign 02/24/2022   Anxiety 11/13/2021   Impaired fasting blood sugar 02/20/2021   Encounter for health maintenance examination in adult 02/15/2021   Vitamin deficiency 02/15/2021   High risk medication use 02/15/2021   Prostate enlargement 02/15/2021   Screening for heart disease 02/15/2021   Screening for diabetes mellitus 02/15/2021   Screening for prostate cancer 02/15/2021   Screening for thyroid  disorder 02/15/2021   Vaccine counseling 02/15/2021   BMI 36.0-36.9,adult 11/01/2020   Osteoarthritis of both knees 11/01/2020   OSA (obstructive sleep apnea) 11/01/2020   Screen for colon cancer 11/01/2020   Arthritis of left acromioclavicular joint 08/21/2020   Acute medial meniscus tear of left knee 04/13/2019   Stress fracture of left tibia 04/13/2019   Allergic rhinitis 05/28/2009   Past Medical History:  Diagnosis Date   ALLERGIC RHINITIS    Arthralgia    knees, shoulder   Arthritis    OSA (obstructive sleep apnea) 2011   on CPAP most nights   Wears contact lenses     Family History  Problem Relation Age of Onset   Allergies Mother    Diabetes Mother    Arthritis Mother    Hyperlipidemia Father    Hypertension Father    Arthritis Brother    Brain cancer Maternal Uncle    Stroke Maternal Grandmother    Heart disease Maternal Grandfather    Heart disease Paternal Grandmother    Lung cancer Paternal Grandfather    Cancer Paternal Grandfather    Colon cancer Neg Hx    Colon polyps Neg Hx    Rectal cancer Neg Hx    Stomach cancer Neg Hx     Esophageal cancer Neg Hx     Past Surgical History:  Procedure Laterality Date   BUNIONECTOMY Left    KNEE ARTHROSCOPY WITH SUBCHONDROPLASTY Left 06/22/2019   Procedure: LEFT KNEE ARTHROSCOPY WITH PARTIAL MEDIAL MENISCECTOMY, SUBCHONDROPLASTY AND EXCISION OF CYST;  Surgeon: Wes Hamman, MD;  Location: [facility];  Service: Orthopedics;  Laterality: Left;   NASAL SINUS SURGERY  2000   for deviated septum and turbonate reduction   ROTATOR CUFF REPAIR     right   TONSILLECTOMY  1994   Social History   Occupational History   Occupation: Psychologist, occupational: Advertising copywriter  Tobacco Use   Smoking status: Former    Types: Cigars   Smokeless tobacco: Never  Vaping Use   Vaping status: Never Used  Substance and Sexual Activity   Alcohol use: Not Currently    Comment: occas   Drug use: Never   Sexual activity:  Not on file

## 2023-12-26 ENCOUNTER — Other Ambulatory Visit: Payer: Self-pay | Admitting: Medical

## 2023-12-31 ENCOUNTER — Ambulatory Visit: Admitting: Medical

## 2024-01-01 ENCOUNTER — Encounter: Payer: Self-pay | Admitting: Medical

## 2024-01-08 ENCOUNTER — Encounter: Payer: Self-pay | Admitting: Orthopaedic Surgery

## 2024-01-27 DIAGNOSIS — F331 Major depressive disorder, recurrent, moderate: Secondary | ICD-10-CM | POA: Diagnosis not present

## 2024-01-27 DIAGNOSIS — F39 Unspecified mood [affective] disorder: Secondary | ICD-10-CM | POA: Diagnosis not present

## 2024-01-31 NOTE — Telephone Encounter (Signed)
 See message. thanks

## 2024-02-02 ENCOUNTER — Ambulatory Visit: Admitting: Orthopaedic Surgery

## 2024-02-02 ENCOUNTER — Telehealth: Payer: Self-pay | Admitting: Orthopaedic Surgery

## 2024-02-02 NOTE — Telephone Encounter (Signed)
 Left message on patient's voicemail offering dates available for left shoulder surgery with Dr. Jerri.  Provided my name and direct number for scheduling.

## 2024-02-23 ENCOUNTER — Other Ambulatory Visit: Payer: Self-pay | Admitting: Physician Assistant

## 2024-02-23 MED ORDER — HYDROCODONE-ACETAMINOPHEN 5-325 MG PO TABS: 1.0000 | ORAL_TABLET | Freq: Three times a day (TID) | ORAL | 0 refills | Status: DC | PRN

## 2024-02-23 MED ORDER — ONDANSETRON HCL 4 MG PO TABS: 4.0000 mg | ORAL_TABLET | Freq: Three times a day (TID) | ORAL | 0 refills | Status: DC | PRN

## 2024-02-25 ENCOUNTER — Encounter: Payer: Self-pay | Admitting: Orthopaedic Surgery

## 2024-02-25 DIAGNOSIS — M19012 Primary osteoarthritis, left shoulder: Secondary | ICD-10-CM | POA: Diagnosis not present

## 2024-02-25 DIAGNOSIS — M75112 Incomplete rotator cuff tear or rupture of left shoulder, not specified as traumatic: Secondary | ICD-10-CM | POA: Diagnosis not present

## 2024-02-25 DIAGNOSIS — M7542 Impingement syndrome of left shoulder: Secondary | ICD-10-CM | POA: Diagnosis not present

## 2024-02-25 DIAGNOSIS — G8918 Other acute postprocedural pain: Secondary | ICD-10-CM | POA: Diagnosis not present

## 2024-02-26 ENCOUNTER — Other Ambulatory Visit: Payer: Self-pay | Admitting: Orthopaedic Surgery

## 2024-02-26 ENCOUNTER — Encounter: Payer: Self-pay | Admitting: Orthopaedic Surgery

## 2024-02-26 MED ORDER — KETOROLAC TROMETHAMINE 10 MG PO TABS: 10.0000 mg | ORAL_TABLET | Freq: Two times a day (BID) | ORAL | 0 refills | Status: DC | PRN

## 2024-03-04 ENCOUNTER — Ambulatory Visit: Admitting: Physician Assistant

## 2024-03-04 DIAGNOSIS — Z9889 Other specified postprocedural states: Secondary | ICD-10-CM

## 2024-03-04 MED ORDER — HYDROCODONE-ACETAMINOPHEN 5-325 MG PO TABS: 1.0000 | ORAL_TABLET | Freq: Two times a day (BID) | ORAL | 0 refills | Status: DC | PRN

## 2024-03-04 NOTE — Progress Notes (Signed)
 Post-Op Visit Note   Patient: Tony Waller           Date of Birth: 17-Jan-1971           MRN: [id number] Visit Date: 03/04/2024 PCP: Bulah Alm RAMAN, PA-C   Assessment & Plan:  Chief Complaint:  Chief Complaint  Patient presents with   Left Shoulder - Routine Post Op    02/25/24 LT SHOULDER SCOPE   Visit Diagnoses:  1. History of arthroscopy of left shoulder     Plan: Patient is a pleasant 53 year old gentleman who comes in today 1 week status post left shoulder arthroscopic debridement, decompression, date of surgery 02/25/2024.  He has been doing well.  He is in some mild pain and is taking Norco at night for this.  Examination of his left shoulder reveals well-healing surgical portals with nylon sutures in place.  No evidence of infection or cellulitis.  Fingers warm well-perfused.  He is neurovascular intact distally.  Today, sutures removed and Steri-Strips applied.  Intraoperative pictures reviewed.  We have refilled his Norco.  I have sent in a referral to start outpatient PT.  He will follow-up in 5 weeks for recheck.  Call with concerns or questions.  Follow-Up Instructions: Return in about 5 weeks (around 04/08/2024).   Orders:  No orders of the defined types were placed in this encounter.  No orders of the defined types were placed in this encounter.   Imaging:   PMFS History: Patient Active Problem List   Diagnosis Date Noted   Trochanteric bursitis, left hip 12/23/2023   Vitamin D  deficiency 05/18/2023   Essential hypertension, benign 02/24/2022   Anxiety 11/13/2021   Impaired fasting blood sugar 02/20/2021   Encounter for health maintenance examination in adult 02/15/2021   Vitamin deficiency 02/15/2021   High risk medication use 02/15/2021   Prostate enlargement 02/15/2021   Screening for heart disease 02/15/2021   Screening for diabetes mellitus 02/15/2021   Screening for prostate cancer 02/15/2021   Screening for thyroid  disorder 02/15/2021   Vaccine  counseling 02/15/2021   BMI 36.0-36.9,adult 11/01/2020   Osteoarthritis of both knees 11/01/2020   OSA (obstructive sleep apnea) 11/01/2020   Screen for colon cancer 11/01/2020   Arthritis of left acromioclavicular joint 08/21/2020   Acute medial meniscus tear of left knee 04/13/2019   Stress fracture of left tibia 04/13/2019   Allergic rhinitis 05/28/2009   Past Medical History:  Diagnosis Date   ALLERGIC RHINITIS    Arthralgia    knees, shoulder   Arthritis    OSA (obstructive sleep apnea) 2011   on CPAP most nights   Wears contact lenses     Family History  Problem Relation Age of Onset   Allergies Mother    Diabetes Mother    Arthritis Mother    Hyperlipidemia Father    Hypertension Father    Arthritis Brother    Brain cancer Maternal Uncle    Stroke Maternal Grandmother    Heart disease Maternal Grandfather    Heart disease Paternal Grandmother    Lung cancer Paternal Grandfather    Cancer Paternal Grandfather    Colon cancer Neg Hx    Colon polyps Neg Hx    Rectal cancer Neg Hx    Stomach cancer Neg Hx    Esophageal cancer Neg Hx     Past Surgical History:  Procedure Laterality Date   BUNIONECTOMY Left    KNEE ARTHROSCOPY WITH SUBCHONDROPLASTY Left 06/22/2019   Procedure: LEFT KNEE ARTHROSCOPY WITH  PARTIAL MEDIAL MENISCECTOMY, SUBCHONDROPLASTY AND EXCISION OF CYST;  Surgeon: Jerri Kay HERO, MD;  Location: [facility];  Service: Orthopedics;  Laterality: Left;   NASAL SINUS SURGERY  2000   for deviated septum and turbonate reduction   ROTATOR CUFF REPAIR     right   TONSILLECTOMY  1994   Social History   Occupational History   Occupation: Psychologist, occupational: Advertising copywriter  Tobacco Use   Smoking status: Former    Types: Cigars   Smokeless tobacco: Never  Vaping Use   Vaping status: Never Used  Substance and Sexual Activity   Alcohol use: Not Currently    Comment: occas   Drug use: Never   Sexual activity: Not on file

## 2024-03-18 NOTE — Therapy (Incomplete)
 OUTPATIENT PHYSICAL THERAPY UPPER EXTREMITY EVALUATION   Patient Name: Tony Waller MRN: [id number] DOB:[birth year], 53 y.o., male Today's Date: 03/18/2024  END OF SESSION:   Past Medical History:  Diagnosis Date   ALLERGIC RHINITIS    Arthralgia    knees, shoulder   Arthritis    OSA (obstructive sleep apnea) 2011   on CPAP most nights   Wears contact lenses    Past Surgical History:  Procedure Laterality Date   BUNIONECTOMY Left    KNEE ARTHROSCOPY WITH SUBCHONDROPLASTY Left 06/22/2019   Procedure: LEFT KNEE ARTHROSCOPY WITH PARTIAL MEDIAL MENISCECTOMY, SUBCHONDROPLASTY AND EXCISION OF CYST;  Surgeon: Jerri Kay HERO, MD;  Location: [facility];  Service: Orthopedics;  Laterality: Left;   NASAL SINUS SURGERY  2000   for deviated septum and turbonate reduction   ROTATOR CUFF REPAIR     right   TONSILLECTOMY  1994   Patient Active Problem List   Diagnosis Date Noted   Trochanteric bursitis, left hip 12/23/2023   Vitamin D  deficiency 05/18/2023   Essential hypertension, benign 02/24/2022   Anxiety 11/13/2021   Impaired fasting blood sugar 02/20/2021   Encounter for health maintenance examination in adult 02/15/2021   Vitamin deficiency 02/15/2021   High risk medication use 02/15/2021   Prostate enlargement 02/15/2021   Screening for heart disease 02/15/2021   Screening for diabetes mellitus 02/15/2021   Screening for prostate cancer 02/15/2021   Screening for thyroid  disorder 02/15/2021   Vaccine counseling 02/15/2021   BMI 36.0-36.9,adult 11/01/2020   Osteoarthritis of both knees 11/01/2020   OSA (obstructive sleep apnea) 11/01/2020   Screen for colon cancer 11/01/2020   Arthritis of left acromioclavicular joint 08/21/2020   Acute medial meniscus tear of left knee 04/13/2019   Stress fracture of left tibia 04/13/2019   Allergic rhinitis 05/28/2009    PCP: Alm GORMAN Gent, PA-C  REFERRING PROVIDER: Ronal LITTIE Bimler, PA-C  REFERRING DIAG: [phone]  (ICD-10-CM) - History of arthroscopy of left shoulder  THERAPY DIAG:  No diagnosis found.  Rationale for Evaluation and Treatment: Rehabilitation  ONSET DATE: ***  SUBJECTIVE:                                                                                                                                                                                      SUBJECTIVE STATEMENT: *** Hand dominance: {MISC; OT HAND DOMINANCE:[id number]}  PERTINENT HISTORY: ***  PAIN:  Are you having pain? Yes: NPRS scale: *** Pain location: *** Pain description: *** Aggravating factors: *** Relieving factors: ***  PRECAUTIONS: Shoulder  RED FLAGS: {PT Red Flags:29287}   WEIGHT BEARING RESTRICTIONS: {Yes ***/No:24003}  FALLS:  Has patient fallen in  last 6 months? {fallsyesno:27318}  LIVING ENVIRONMENT: Lives with: {[facility] lives with:25569::lives with their family} Lives in: {Lives in:25570} Stairs: {opstairs:27293} Has following equipment at home: {Assistive devices:23999}  OCCUPATION: ***  PLOF: {PLOF:24004}  PATIENT GOALS: ***  NEXT MD VISIT: ***  OBJECTIVE:  Note: Objective measures were completed at Evaluation unless otherwise noted.  DIAGNOSTIC FINDINGS:  ***  PATIENT SURVEYS :  PSFS: THE PATIENT SPECIFIC FUNCTIONAL SCALE  Place score of 0-10 (0 = unable to perform activity and 10 = able to perform activity at the same level as before injury or problem)  Activity Date: 03/21/2024         2.     3.     4.      Total Score ***      Total Score = Sum of activity scores/number of activities  Minimally Detectable Change: 3 points (for single activity); 2 points (for average score)  [facility] (nd). The Patient Specific Functional Scale . Retrieved from [email or web address]   COGNITION: Overall cognitive status: {cognition:24006}     SENSATION: {sensation:27233}  POSTURE: ***  UPPER EXTREMITY  ROM:    ROM Right Eval 03/21/2024 Left Eval 03/21/2024  Shoulder flexion    Shoulder extension    Shoulder abduction    Shoulder adduction    Shoulder internal rotation    Shoulder external rotation    Elbow flexion    Elbow extension    Wrist flexion    Wrist extension    Wrist ulnar deviation    Wrist radial deviation    Wrist pronation    Wrist supination    (Blank rows = not tested)  UPPER EXTREMITY MMT:  MMT Right Eval 03/21/2024 Left Eval 03/21/2024  Shoulder flexion    Shoulder extension    Shoulder abduction    Shoulder adduction    Shoulder internal rotation    Shoulder external rotation    Middle trapezius    Lower trapezius    Elbow flexion    Elbow extension    Wrist flexion    Wrist extension    Wrist ulnar deviation    Wrist radial deviation    Wrist pronation    Wrist supination    Grip strength (lbs)    (Blank rows = not tested)  SHOULDER SPECIAL TESTS: Impingement tests: {shoulder impingement test:25231:a} SLAP lesions: {SLAP lesions:25232} Instability tests: {shoulder instability test:25233} Rotator cuff assessment: {rotator cuff assessment:25234} Biceps assessment: {biceps assessment:25235}  JOINT MOBILITY TESTING:  ***  PALPATION:  ***                                                                                                                             TREATMENT DATE: ***   PATIENT EDUCATION: Education details: *** Person educated: {Person educated:25204} Education method: {Education Method:25205} Education comprehension: {Education Comprehension:25206}  HOME EXERCISE PROGRAM: ***  ASSESSMENT:  CLINICAL IMPRESSION: Patient is a 53 y.o. M who was seen today for physical therapy evaluation  and treatment for s/p left shoulder arthroscopic debridement and decompression with functional mobility, strength, and ROM deficits with high pain levels leading to decreased overall independence. *** Patient will benefit from skilled  PT to improve upon deficits noted above.     OBJECTIVE IMPAIRMENTS: {opptimpairments:25111}.   ACTIVITY LIMITATIONS: {activitylimitations:27494}  PARTICIPATION LIMITATIONS: {participationrestrictions:25113}  PERSONAL FACTORS: 3+ comorbidities: HTN, OSA, and arthralgia of several joints are also affecting patient's functional outcome.   REHAB POTENTIAL: {rehabpotential:25112}  CLINICAL DECISION MAKING: {clinical decision making:25114}  EVALUATION COMPLEXITY: {Evaluation complexity:25115}  GOALS: Goals reviewed with patient? {yes/no:20286}  SHORT TERM GOALS: Target date: ***  *** Baseline: Goal status: {GOALSTATUS:25110}  2.  *** Baseline:  Goal status: {GOALSTATUS:25110}  3.  *** Baseline:  Goal status: {GOALSTATUS:25110}  4.  *** Baseline:  Goal status: {GOALSTATUS:25110}  5.  *** Baseline:  Goal status: {GOALSTATUS:25110}  6.  *** Baseline:  Goal status: {GOALSTATUS:25110}  LONG TERM GOALS: Target date: ***  *** Baseline:  Goal status: {GOALSTATUS:25110}  2.  *** Baseline:  Goal status: {GOALSTATUS:25110}  3.  *** Baseline:  Goal status: {GOALSTATUS:25110}  4.  *** Baseline:  Goal status: {GOALSTATUS:25110}  5.  *** Baseline:  Goal status: {GOALSTATUS:25110}  6.  *** Baseline:  Goal status: {GOALSTATUS:25110}  PLAN: PT FREQUENCY: {rehab frequency:25116}  PT DURATION: {rehab duration:25117}  PLANNED INTERVENTIONS: 97164- PT Re-evaluation, 97750- Physical Performance Testing, 97110-Therapeutic exercises, 97530- Therapeutic activity, [id number]- Neuromuscular re-education, 97535- Self Care, 02859- Manual therapy, [id number]- Gait training, [phone]- Electrical stimulation (unattended), [phone]- Electrical stimulation (manual), [id number]- Vasopneumatic device, [id number]- Ultrasound, [id number]- Traction (mechanical), [id number]- Ionotophoresis 4mg /ml Dexamethasone , 79439 (1-2 muscles), 20561 (3+ muscles)- Dry Needling, Patient/Family education, Balance training, Stair  training, Taping, Joint mobilization, Spinal mobilization, Scar mobilization, Cryotherapy, and Moist heat  PLAN FOR NEXT SESSION: ***   Susannah Daring, PT, DPT 03/18/24 9:56 AM

## 2024-03-21 ENCOUNTER — Ambulatory Visit

## 2024-03-22 NOTE — Telephone Encounter (Signed)
 That is fine.   Thank you

## 2024-03-22 NOTE — Telephone Encounter (Signed)
 Sure that is fine.  Just clarify with him what the letter needs to say and the relevant dates.

## 2024-03-29 ENCOUNTER — Other Ambulatory Visit: Payer: Self-pay | Admitting: Medical

## 2024-03-29 NOTE — Telephone Encounter (Signed)
 Patient scheduled for tomorrow

## 2024-03-29 NOTE — Telephone Encounter (Signed)
 Is this okay to refill?

## 2024-03-30 ENCOUNTER — Ambulatory Visit: Admitting: Medical

## 2024-03-31 ENCOUNTER — Ambulatory Visit: Admitting: Medical

## 2024-04-04 ENCOUNTER — Ambulatory Visit: Admitting: Medical

## 2024-04-05 ENCOUNTER — Other Ambulatory Visit (INDEPENDENT_AMBULATORY_CARE_PROVIDER_SITE_OTHER): Payer: Self-pay

## 2024-04-05 ENCOUNTER — Ambulatory Visit (INDEPENDENT_AMBULATORY_CARE_PROVIDER_SITE_OTHER): Admitting: Orthopaedic Surgery

## 2024-04-05 DIAGNOSIS — M25562 Pain in left knee: Secondary | ICD-10-CM | POA: Diagnosis not present

## 2024-04-05 DIAGNOSIS — M25561 Pain in right knee: Secondary | ICD-10-CM

## 2024-04-05 DIAGNOSIS — G8929 Other chronic pain: Secondary | ICD-10-CM

## 2024-04-05 MED ORDER — METHYLPREDNISOLONE ACETATE 40 MG/ML IJ SUSP
40.0000 mg | INTRAMUSCULAR | Status: AC | PRN
  Administered 2024-04-05: 40 mg via INTRA_ARTICULAR

## 2024-04-05 MED ORDER — BUPIVACAINE HCL 0.5 % IJ SOLN
2.0000 mL | INTRAMUSCULAR | Status: AC | PRN
  Administered 2024-04-05: 2 mL via INTRA_ARTICULAR

## 2024-04-05 MED ORDER — LIDOCAINE HCL 1 % IJ SOLN
2.0000 mL | INTRAMUSCULAR | Status: AC | PRN
  Administered 2024-04-05: 2 mL

## 2024-04-05 NOTE — Progress Notes (Signed)
   Office Visit Note   Patient: Tony Waller           Date of Birth: 08-16-1970           MRN: [id number] Visit Date: 04/05/2024              Requested by: Bulah Alm RAMAN, PA-C [address] [place],  [place] [address] PCP: Bulah Alm RAMAN, PA-C   Assessment & Plan: Visit Diagnoses:  1. Chronic pain of both knees     Plan: History of Present Illness He is a 53 year old male who presents for cortisone injections for worsening joint pain.  He has received cortisone injections for years to manage joint pain, with only one injection earlier this year. His joint pain is progressively worsening, and he is attempting to manage symptoms and delay further interventions.  Assessment and Plan Bilateral knee osteoarthritis Chronic knee osteoarthritis with worsening symptoms.  - Obtain approval for gel injections. - Administer bilateral cortisone injections today  Follow-Up Instructions: No follow-ups on file.   Orders:  Orders Placed This Encounter  Procedures   XR KNEE 3 VIEW LEFT   XR KNEE 3 VIEW RIGHT   No orders of the defined types were placed in this encounter.     Procedures: Large Joint Inj: bilateral knee on 04/05/2024 12:07 PM Indications: pain Details: 22 G needle  Arthrogram: No  Medications (Right): 2 mL lidocaine  1 %; 2 mL bupivacaine  0.5 %; 40 mg methylPREDNISolone  acetate 40 MG/ML Medications (Left): 2 mL lidocaine  1 %; 2 mL bupivacaine  0.5 %; 40 mg methylPREDNISolone  acetate 40 MG/ML Outcome: tolerated well, no immediate complications Patient was prepped and draped in the usual sterile fashion.         Family History  Problem Relation Age of Onset   Allergies Mother    Diabetes Mother    Arthritis Mother    Hyperlipidemia Father    Hypertension Father    Arthritis Brother    Brain cancer Maternal Uncle    Stroke Maternal Grandmother    Heart disease Maternal Grandfather    Heart disease Paternal Grandmother    Lung cancer Paternal  Grandfather    Cancer Paternal Grandfather    Colon cancer Neg Hx    Colon polyps Neg Hx    Rectal cancer Neg Hx    Stomach cancer Neg Hx    Esophageal cancer Neg Hx     Past Surgical History:  Procedure Laterality Date   BUNIONECTOMY Left    KNEE ARTHROSCOPY WITH SUBCHONDROPLASTY Left 06/22/2019   Procedure: LEFT KNEE ARTHROSCOPY WITH PARTIAL MEDIAL MENISCECTOMY, SUBCHONDROPLASTY AND EXCISION OF CYST;  Surgeon: Jerri Kay HERO, MD;  Location: [facility];  Service: Orthopedics;  Laterality: Left;   NASAL SINUS SURGERY  2000   for deviated septum and turbonate reduction   ROTATOR CUFF REPAIR     right   TONSILLECTOMY  1994   Social History   Occupational History   Occupation: Psychologist, occupational: Advertising copywriter  Tobacco Use   Smoking status: Former    Types: Cigars   Smokeless tobacco: Never  Vaping Use   Vaping status: Never Used  Substance and Sexual Activity   Alcohol use: Not Currently    Comment: occas   Drug use: Never   Sexual activity: Not on file

## 2024-04-12 ENCOUNTER — Ambulatory Visit: Admitting: Medical

## 2024-04-19 ENCOUNTER — Encounter: Payer: Self-pay | Admitting: Orthopaedic Surgery

## 2024-04-28 DIAGNOSIS — F1011 Alcohol abuse, in remission: Secondary | ICD-10-CM | POA: Diagnosis not present

## 2024-04-28 DIAGNOSIS — J309 Allergic rhinitis, unspecified: Secondary | ICD-10-CM | POA: Diagnosis not present

## 2024-04-29 DIAGNOSIS — F331 Major depressive disorder, recurrent, moderate: Secondary | ICD-10-CM | POA: Diagnosis not present

## 2024-04-29 DIAGNOSIS — F419 Anxiety disorder, unspecified: Secondary | ICD-10-CM | POA: Diagnosis not present

## 2024-04-29 DIAGNOSIS — F1010 Alcohol abuse, uncomplicated: Secondary | ICD-10-CM | POA: Diagnosis not present

## 2024-05-03 ENCOUNTER — Ambulatory Visit: Admitting: Medical

## 2024-05-06 ENCOUNTER — Other Ambulatory Visit (HOSPITAL_COMMUNITY): Payer: Self-pay

## 2024-05-11 ENCOUNTER — Ambulatory Visit (INDEPENDENT_AMBULATORY_CARE_PROVIDER_SITE_OTHER): Admitting: Medical

## 2024-05-11 ENCOUNTER — Other Ambulatory Visit (HOSPITAL_COMMUNITY): Payer: Self-pay

## 2024-05-11 VITALS — BP 124/80 | HR 82 | Wt 288.6 lb

## 2024-05-11 DIAGNOSIS — F419 Anxiety disorder, unspecified: Secondary | ICD-10-CM

## 2024-05-11 DIAGNOSIS — R7301 Impaired fasting glucose: Secondary | ICD-10-CM

## 2024-05-11 DIAGNOSIS — G4733 Obstructive sleep apnea (adult) (pediatric): Secondary | ICD-10-CM

## 2024-05-11 DIAGNOSIS — N400 Enlarged prostate without lower urinary tract symptoms: Secondary | ICD-10-CM

## 2024-05-11 DIAGNOSIS — F32A Depression, unspecified: Secondary | ICD-10-CM | POA: Insufficient documentation

## 2024-05-11 DIAGNOSIS — Z23 Encounter for immunization: Secondary | ICD-10-CM

## 2024-05-11 DIAGNOSIS — E559 Vitamin D deficiency, unspecified: Secondary | ICD-10-CM

## 2024-05-11 DIAGNOSIS — E669 Obesity, unspecified: Secondary | ICD-10-CM

## 2024-05-11 DIAGNOSIS — I10 Essential (primary) hypertension: Secondary | ICD-10-CM

## 2024-05-11 MED ORDER — TAMSULOSIN HCL 0.4 MG PO CAPS: 0.4000 mg | ORAL_CAPSULE | Freq: Every day | ORAL | 3 refills | Status: AC

## 2024-05-11 MED ORDER — NALTREXONE HCL 50 MG PO TABS: 50.0000 mg | ORAL_TABLET | Freq: Every day | ORAL | 1 refills | Status: AC

## 2024-05-11 MED ORDER — BUPROPION HCL ER (XL) 300 MG PO TB24: 300.0000 mg | ORAL_TABLET | Freq: Every day | ORAL | 1 refills | Status: AC

## 2024-05-11 MED ORDER — AMLODIPINE BESYLATE 5 MG PO TABS: 5.0000 mg | ORAL_TABLET | Freq: Every day | ORAL | 3 refills | Status: AC

## 2024-05-11 NOTE — Progress Notes (Signed)
 Subjective: Chief Complaint  Patient presents with   Medical Management of Chronic Issues    Med check. Needs refills. Would like to discuss testosterone therapy. Got flu and covid shot today. Declined shingrix and pneumonia    History of Present Illness Tony Waller is a 53 year old male who presents for prescription refills   He is seeking refills for several medications including naltrexone , tamsulosin , and Mounjaro. He has been off Mounjaro for about three to four weeks due to pharmacy stock issues. He uses [facility] for his prescriptions.  His insurance uses a specific pharmacy for refills on Mounjaro as specifically.  He has to go through the clinic at his employer for this particular medicine  He uses naltrexone  to reduce alcohol cravings but has not taken it in a while. He drinks regularly, especially during the football season, with a weekly intake of nine to eleven drinks. He has not seen a counselor or attended AA meetings for alcohol use. He is trying to cut back on alcohol to manage his weight, which he is working on with a trainer after a period of inactivity due to shoulder surgery.  He is interested in testosterone therapy due to symptoms of fatigue, poor sleep, and decreased sexual drive. He has not had his testosterone levels checked recently but is scheduled for a physical later this month.  He is currently taking several medications including tamsulosin  for prostate issues, Wellbutrin  (bupropion ) for mood, and amlodipine  for blood pressure. He needs refills for these medications.  No other aggravating or relieving factors. No other complaint.  Past Medical History:  Diagnosis Date   ALLERGIC RHINITIS    Arthralgia    knees, shoulder   Arthritis    OSA (obstructive sleep apnea) 2011   on CPAP most nights   Wears contact lenses    Current Outpatient Medications on File Prior to Visit  Medication Sig Dispense Refill   acyclovir  (ZOVIRAX ) 400 MG tablet TAKE 1 TABLET BY  MOUTH TWICE A DAY 180 tablet 1   ibuprofen  (ADVIL ) 800 MG tablet TAKE 1 TABLET BY MOUTH EVERY 8 HOURS AS NEEDED 60 tablet 2   sildenafil  (VIAGRA ) 100 MG tablet 1/2 TO 1 TABLET DAILY AS NEEDED. MAX 1 TABLET PER 24 HOURS. TAKE 30 TO 45 MINUTES BEFORE SEXUAL ACTIVITY 6 tablet 1   tirzepatide  (ZEPBOUND ) 10 MG/0.5ML Pen Inject 10 mg into the skin once a week. 2 mL 0   tirzepatide  (ZEPBOUND ) 12.5 MG/0.5ML Pen Inject 12.5 mg into the skin once a week. (Patient not taking: Reported on 05/11/2024) 2 mL 1   No current facility-administered medications on file prior to visit.   ROS as in subjective   Objective: BP 124/80   Pulse 82   Wt 288 lb 9.6 oz (130.9 kg)   SpO2 98%   BMI 38.08 kg/m   Wt Readings from Last 3 Encounters:  05/11/24 288 lb 9.6 oz (130.9 kg)  09/30/23 275 lb (124.7 kg)  08/13/23 277 lb 6.4 oz (125.8 kg)   BP Readings from Last 3 Encounters:  05/11/24 124/80  08/13/23 120/80  05/18/23 (!) 140/100    Gen: wd, wn, nad Psych: pleasant, answers question appropriately,    Assessment and Plan Encounter Diagnoses  Name Primary?   Prostate enlargement Yes   Essential hypertension, benign    Needs flu shot    COVID-19 vaccine administered    Anxiety and depression    Vitamin D  deficiency    Impaired fasting blood sugar  Obesity with serious comorbidity, unspecified class, unspecified obesity type    OSA (obstructive sleep apnea)      Benign prostatic hyperplasia with lower urinary tract symptoms - Continue tamsulosin  (Flomax ).  Hypertension - Continue amlodipine  5 mg daily.  Obesity, Weight gain - Unfortunately there has been inconsistency on pharmacies availability to keep his Zepbound  in stock - Advise he talk to his insurance about the program they have his is not working to keep his medicine refills consistent. -We discussed using a different pharmacy if possible -Continue efforts with regular exercise, healthy eating habits and efforts to lose  weight -Plan to check testosterone along with his labs at his next visit  Vitamin D  deficiency Previously diagnosed and treated with supplements - Continue vitamin D  supplement  Depression, anxiety, support with obesity and cravings Continue bupropion  Wellbutrin  XL 300 mg to help with history of anxiety depression and cravings and obesity  Sleep apnea - Continued efforts to lose weight through healthy diet and exercise/lifestyle changes - Continue Zepbound   Alcohol use -Counseled on the need to cut significantly down or potentially avoid alcohol use as he seems to be having a problem with his -Refilled naltrexone  as this has been helpful for him in the past -AUDIT score of 9 -consider AA meetings, counseling    Tony Waller was seen today for medical management of chronic issues.  Diagnoses and all orders for this visit:  Prostate enlargement  Essential hypertension, benign  Needs flu shot -     Flu vaccine trivalent PF, 6mos and older(Flulaval,Afluria,Fluarix,Fluzone)  COVID-19 vaccine administered -     Pfizer Comirnaty Covid-19 Vaccine 64yrs & older  Anxiety and depression  Vitamin D  deficiency  Impaired fasting blood sugar  Obesity with serious comorbidity, unspecified class, unspecified obesity type  OSA (obstructive sleep apnea)  Other orders -     tamsulosin  (FLOMAX ) 0.4 MG CAPS capsule; Take 1 capsule (0.4 mg total) by mouth daily. -     buPROPion  (WELLBUTRIN  XL) 300 MG 24 hr tablet; Take 1 tablet (300 mg total) by mouth daily. -     amLODipine  (NORVASC ) 5 MG tablet; Take 1 tablet (5 mg total) by mouth daily. -     naltrexone  (DEPADE) 50 MG tablet; Take 1 tablet (50 mg total) by mouth daily.    F/u in a month for well visit and fasting labs as scheduled

## 2024-05-12 ENCOUNTER — Other Ambulatory Visit (HOSPITAL_COMMUNITY): Payer: Self-pay

## 2024-05-19 ENCOUNTER — Ambulatory Visit: Admitting: Medical

## 2024-05-19 VITALS — BP 118/70 | HR 80 | Ht 72.0 in | Wt 283.8 lb

## 2024-05-19 DIAGNOSIS — E559 Vitamin D deficiency, unspecified: Secondary | ICD-10-CM

## 2024-05-19 DIAGNOSIS — Z0000 Encounter for general adult medical examination without abnormal findings: Secondary | ICD-10-CM | POA: Diagnosis not present

## 2024-05-19 DIAGNOSIS — I10 Essential (primary) hypertension: Secondary | ICD-10-CM | POA: Diagnosis not present

## 2024-05-19 DIAGNOSIS — Z1389 Encounter for screening for other disorder: Secondary | ICD-10-CM

## 2024-05-19 DIAGNOSIS — R5383 Other fatigue: Secondary | ICD-10-CM | POA: Diagnosis not present

## 2024-05-19 DIAGNOSIS — R7301 Impaired fasting glucose: Secondary | ICD-10-CM | POA: Diagnosis not present

## 2024-05-19 DIAGNOSIS — Z125 Encounter for screening for malignant neoplasm of prostate: Secondary | ICD-10-CM

## 2024-05-19 LAB — LIPID PANEL

## 2024-05-19 NOTE — Progress Notes (Signed)
 Name: Tony Waller   Date of Visit: 05/19/24   Date of last visit with me: 05/11/2024   CHIEF COMPLAINT:  Chief Complaint  Patient presents with   Annual Exam    Fasting cpe, no concerns       HPI:  Discussed the use of AI scribe software for clinical note transcription with the patient, who gave verbal consent to proceed.  History of Present Illness   Tony Waller is a 53 year old male who presents for a routine follow-up and health maintenance.  Patient Care Team: Sayvion Vigen, Alm RAMAN, PA-C as PCP - General (Family Medicine) Jerri Kay HERO, MD as Attending Physician (Orthopedic Surgery) Dr. Glendia Holt, GI Dentist Eye doctor   He is currently taking amlodipine  5 mg daily for hypertension, Flomax , naltrexone  for suppression of alcohol cravings, Wellbutrin  300 XL, and Acyclovir  as needed for herpes. He has not used Acyclovir  in years due to the absence of outbreaks.  He recently restarted Zepbound , on 2nd week of Zepbound   Reviewed their medical, surgical, family, social, medication, and allergy history and updated chart as appropriate.  Allergies  Allergen Reactions   Penicillins Hives    Past Medical History:  Diagnosis Date   ALLERGIC RHINITIS    Arthralgia    knees, shoulder   Arthritis    OSA (obstructive sleep apnea) 2011   on CPAP most nights   Wears contact lenses      Current Outpatient Medications:    acyclovir  (ZOVIRAX ) 400 MG tablet, TAKE 1 TABLET BY MOUTH TWICE A DAY, Disp: 180 tablet, Rfl: 1   amLODipine  (NORVASC ) 5 MG tablet, Take 1 tablet (5 mg total) by mouth daily., Disp: 90 tablet, Rfl: 3   buPROPion  (WELLBUTRIN  XL) 300 MG 24 hr tablet, Take 1 tablet (300 mg total) by mouth daily., Disp: 90 tablet, Rfl: 1   ibuprofen  (ADVIL ) 800 MG tablet, TAKE 1 TABLET BY MOUTH EVERY 8 HOURS AS NEEDED, Disp: 60 tablet, Rfl: 2   naltrexone  (DEPADE) 50 MG tablet, Take 1 tablet (50 mg total) by mouth daily., Disp: 90 tablet, Rfl: 1   sildenafil  (VIAGRA ) 100 MG  tablet, 1/2 TO 1 TABLET DAILY AS NEEDED. MAX 1 TABLET PER 24 HOURS. TAKE 30 TO 45 MINUTES BEFORE SEXUAL ACTIVITY, Disp: 6 tablet, Rfl: 1   tamsulosin  (FLOMAX ) 0.4 MG CAPS capsule, Take 1 capsule (0.4 mg total) by mouth daily., Disp: 90 capsule, Rfl: 3   tirzepatide  (ZEPBOUND ) 12.5 MG/0.5ML Pen, Inject 12.5 mg into the skin once a week., Disp: 2 mL, Rfl: 1  Family History  Problem Relation Age of Onset   Allergies Mother    Diabetes Mother    Arthritis Mother    Hyperlipidemia Father    Hypertension Father    Arthritis Brother    Brain cancer Maternal Uncle    Stroke Maternal Grandmother    Heart disease Maternal Grandfather    Heart disease Paternal Grandmother    Lung cancer Paternal Grandfather    Cancer Paternal Grandfather    Colon cancer Neg Hx    Colon polyps Neg Hx    Rectal cancer Neg Hx    Stomach cancer Neg Hx    Esophageal cancer Neg Hx     Past Surgical History:  Procedure Laterality Date   BUNIONECTOMY Left    KNEE ARTHROSCOPY WITH SUBCHONDROPLASTY Left 06/22/2019   Procedure: LEFT KNEE ARTHROSCOPY WITH PARTIAL MEDIAL MENISCECTOMY, SUBCHONDROPLASTY AND EXCISION OF CYST;  Surgeon: Jerri Kay HERO, MD;  Location: [facility];  Service: Orthopedics;  Laterality: Left;   NASAL SINUS SURGERY  2000   for deviated septum and turbonate reduction   ROTATOR CUFF REPAIR     right   TONSILLECTOMY  1994     Review of Systems  Constitutional:  Positive for malaise/fatigue. Negative for chills, fever and weight loss.  HENT:  Negative for congestion, ear pain, hearing loss, sore throat and tinnitus.   Eyes:  Negative for blurred vision, pain and redness.  Respiratory:  Negative for cough, hemoptysis and shortness of breath.   Cardiovascular:  Negative for chest pain, palpitations, orthopnea, claudication and leg swelling.  Gastrointestinal:  Negative for abdominal pain, blood in stool, constipation, diarrhea, nausea and vomiting.  Genitourinary:  Negative for  dysuria, flank pain, frequency, hematuria and urgency.  Musculoskeletal:  Negative for falls, joint pain and myalgias.  Skin:  Negative for itching and rash.  Neurological:  Negative for dizziness, tingling, speech change, weakness and headaches.  Endo/Heme/Allergies:  Negative for polydipsia. Does not bruise/bleed easily.  Psychiatric/Behavioral:  Negative for depression and memory loss. The patient is not nervous/anxious and does not have insomnia.      OBJECTIVE:    BP 118/70   Pulse 80   Ht 6' (1.829 m)   Wt 283 lb 12.8 oz (128.7 kg)   BMI 38.49 kg/m   BP Readings from Last 3 Encounters:  05/19/24 118/70  05/11/24 124/80  08/13/23 120/80    Wt Readings from Last 3 Encounters:  05/19/24 283 lb 12.8 oz (128.7 kg)  05/11/24 288 lb 9.6 oz (130.9 kg)  09/30/23 275 lb (124.7 kg)    Physical Exam   General appearance: alert, no distress, WD/WN, African American male Skin: some papular lesions within beard area but no significantly erythema and inflammation today HEENT: normocephalic, conjunctiva/corneas normal, sclerae anicteric, PERRLA, EOMi, light blue rings around irides c/w arcus senilis, nares patent, no discharge or erythema, pharynx normal Oral cavity: MMM, tongue normal, teeth normal Neck: supple, no lymphadenopathy, no thyromegaly, no masses, normal ROM, no bruits Chest: non tender, normal shape and expansion Heart: RRR, normal S1, S2, no murmurs Lungs: CTA bilaterally, no wheezes, rhonchi, or rales Abdomen: +bs, soft, non tender, non distended, no masses, no hepatomegaly, no splenomegaly, no bruits Back: non tender, normal ROM, no scoliosis Musculoskeletal: upper extremities non tender, no obvious deformity, normal ROM throughout, lower extremities non tender, no obvious deformity, normal ROM throughout Extremities: no edema, no cyanosis, no clubbing Pulses: 2+ symmetric, upper and lower extremities, normal cap refill Neurological: alert, oriented x 3, CN2-12  intact, strength normal upper extremities and lower extremities, sensation normal throughout, DTRs 2+ throughout, no cerebellar signs, gait normal Psychiatric: normal affect, behavior normal, pleasant  GU: normal male external genitalia, nontender, no masses, no hernia, no lymphadenopathy Rectal: declined   ASSESSMENT/PLAN:   Encounter Diagnoses  Name Primary?   Encounter for health maintenance examination in adult Yes   Vitamin D  deficiency    Impaired fasting blood sugar    Essential hypertension, benign    Fatigue, unspecified type    Screening for prostate cancer    Screening for hematuria or proteinuria     Separate significant issues discussed: Impaired glucose - updated labs today  HTN - continue Amlodipine  5mg  daily  Fatigue - labs as below   This visit was a preventative care visit, also known as wellness visit or routine physical.   Topics typically include healthy lifestyle, diet, exercise, preventative care, vaccinations, sick and well care, proper use  of emergency dept and after hours care, as well as other concerns.      General Recommendations: Continue to return yearly for your annual wellness and preventative care visits.  This gives us  a chance to discuss healthy lifestyle, exercise, vaccinations, review your chart record, and perform screenings where appropriate.  I recommend you see your eye doctor yearly for routine vision care.  I recommend you see your dentist yearly for routine dental care including hygiene visits twice yearly.   Vaccination  Immunization History  Administered Date(s) Administered   Influenza Whole 05/11/2009   Influenza, Seasonal, Injecte, Preservative Fre 05/18/2023, 05/11/2024   Influenza,inj,Quad PF,6+ Mos 06/02/2018, 05/03/2019, 07/01/2021   PFIZER(Purple Top)SARS-COV-2 Vaccination 10/11/2019, 11/08/2019, 06/25/2020   Pfizer Covid-19 Vaccine Bivalent Booster 2yrs & up 07/01/2021   Pfizer(Comirnaty)Fall Seasonal Vaccine 12  years and older 05/18/2023, 05/11/2024   Tdap 06/16/2014, 07/11/2017    Vaccine recommendations: Flu, Prevnar, Shingrix, covid   Screening for cancer: Colon cancer screening: Prior or last colon cancer screen: 01/2022  Testicular cancer screening You should do a monthly self testicular exam if you are between 45-54 years old, and we typically do a testicular exam on the yearly physical for this same age group.   Prostate Cancer screening: The recommended prostate cancer screening test is a blood test called the prostate-specific antigen (PSA) test. PSA is a protein that is made in the prostate. As you age, your prostate naturally produces more PSA. Abnormally high PSA levels may be caused by: Prostate cancer. An enlarged prostate that is not caused by cancer (benign prostatic hyperplasia, or BPH). This condition is very common in older men. A prostate gland infection (prostatitis) or urinary tract infection. Certain medicines such as male hormones (like testosterone) or other medicines that raise testosterone levels. A rectal exam may be done as part of prostate cancer screening to help provide information about the size of your prostate gland. When a rectal exam is performed, it should be done after the PSA level is drawn to avoid any effect on the results.   Skin cancer screening: Check your skin regularly for new changes, growing lesions, or other lesions of concern Come in for evaluation if you have skin lesions of concern.   Lung cancer screening: If you have a greater than 20 pack year history of tobacco use, then you may qualify for lung cancer screening with a chest CT scan.   Please call your insurance company to inquire about coverage for this test.   Pancreatic cancer:  no current screening test is available or routinely recommended. (risk factors: smoking, overweight or obese, diabetes, chronic pancreatitis, work exposure - dry cleaning, metal working, 53yo>, M>F,  Tree surgeon, family hx/o, hereditary breast, ovarian, melanoma, lynch, peutz-jeghers).  Symptoms: jaundice, dark urine, light color or greasy stools, itchy skin, belly or back pain, weight loss, poor appetite, nausea, vomiting, liver enlargement, DVT/blood clots.   We currently don't have screenings for other cancers besides breast, cervical, colon, and lung cancers.  If you have a strong family history of cancer or have other cancer screening concerns, please let me know.  Genetic testing referral is an option for individuals with high cancer risk in the family.  There are some other cancer screenings in development currently.   Bone health: Get at least 150 minutes of aerobic exercise weekly Get weight bearing exercise at least once weekly Bone density test:  A bone density test is an imaging test that uses a type of X-ray to measure the  amount of calcium and other minerals in your bones. The test may be used to diagnose or screen you for a condition that causes weak or thin bones (osteoporosis), predict your risk for a broken bone (fracture), or determine how well your osteoporosis treatment is working. The bone density test is recommended for females 65 and older, or females or males <65 if certain risk factors such as thyroid  disease, long term use of steroids such as for asthma or rheumatological issues, vitamin D  deficiency, estrogen deficiency, family history of osteoporosis, self or family history of fragility fracture in first degree relative.    Heart health: Get at least 150 minutes of aerobic exercise weekly Limit alcohol It is important to maintain a healthy blood pressure and healthy cholesterol numbers  Heart disease screening: Screening for heart disease includes screening for blood pressure, fasting lipids, glucose/diabetes screening, BMI height to weight ratio, reviewed of smoking status, physical activity, and diet.    Goals include blood pressure 120/80 or less,  maintaining a healthy lipid/cholesterol profile, preventing diabetes or keeping diabetes numbers under good control, not smoking or using tobacco products, exercising most days per week or at least 150 minutes per week of exercise, and eating healthy variety of fruits and vegetables, healthy oils, and avoiding unhealthy food choices like fried food, fast food, high sugar and high cholesterol foods.    Consider CT coronary test   Vascular disease screening: For higher risk individuals including smokers, diabetics, patients with known heart disease or high blood pressure, kidney disease, and others, screening for vascular disease or atherosclerosis of the arteries is available.  Examples may include carotid ultrasound, abdominal aortic ultrasound, ABI blood flow screening in the legs, thoracic aorta screening.   Medical care options: I recommend you continue to seek care here first for routine care.  We try really hard to have available appointments Monday through Friday daytime hours for sick visits, acute visits, and physicals.  Urgent care should be used for after hours and weekends for significant issues that cannot wait till the next day.  The emergency department should be used for significant potentially life-threatening emergencies.  The emergency department is expensive, can often have long wait times for less significant concerns, so try to utilize primary care, urgent care, or telemedicine when possible to avoid unnecessary trips to the emergency department.  Virtual visits and telemedicine have been introduced since the pandemic started in 2020, and can be convenient ways to receive medical care.  We offer virtual appointments as well to assist you in a variety of options to seek medical care.   Legal  Take the time to do a last will and testament, Advanced Directives including Health Care Power of Attorney and Living Will documents.  Don't leave your family with burdens that can be handled  ahead of time.   Advanced Directives: I recommend you consider completing a Health Care Power of Attorney and Living Will.   These documents respect your wishes and help alleviate burdens on your loved ones if you were to become terminally ill or be in a position to need those documents enforced.    You can complete Advanced Directives yourself, have them notarized, then have copies made for our office, for you and for anybody you feel should have them in safe keeping.  Or, you can have an attorney prepare these documents.   If you haven't updated your Last Will and Testament in a while, it may be worthwhile having an attorney prepare these documents  together and save on some costs.       Kadarius was seen today for annual exam.  Diagnoses and all orders for this visit:  Encounter for health maintenance examination in adult -     CBC -     Comprehensive metabolic panel with GFR -     Lipid panel -     PSA -     Hemoglobin A1c -     Urinalysis, Routine w reflex microscopic -     Testosterone -     VITAMIN D  25 Hydroxy (Vit-D Deficiency, Fractures)  Vitamin D  deficiency -     VITAMIN D  25 Hydroxy (Vit-D Deficiency, Fractures)  Impaired fasting blood sugar -     Hemoglobin A1c  Essential hypertension, benign -     Comprehensive metabolic panel with GFR  Fatigue, unspecified type -     Testosterone -     VITAMIN D  25 Hydroxy (Vit-D Deficiency, Fractures)  Screening for prostate cancer -     PSA  Screening for hematuria or proteinuria -     Urinalysis, Routine w reflex microscopic     I recommend follow up yearly for a routine physical.   [facility]

## 2024-05-20 LAB — URINALYSIS, ROUTINE W REFLEX MICROSCOPIC
Specific Gravity, UA: 1.019 (ref 1.005–1.030)
Urobilinogen, Ur: 0.2 mg/dL (ref 0.2–1.0)
pH, UA: 6 (ref 5.0–7.5)

## 2024-05-20 LAB — COMPREHENSIVE METABOLIC PANEL WITH GFR
ALT: 27 IU/L (ref 0–44)
AST: 23 IU/L (ref 0–40)
Albumin: 4.5 g/dL (ref 3.8–4.9)
Alkaline Phosphatase: 73 IU/L (ref 47–123)
BUN/Creatinine Ratio: 12 (ref 9–20)
BUN: 13 mg/dL (ref 6–24)
Bilirubin Total: 0.4 mg/dL (ref 0.0–1.2)
CO2: 21 mmol/L (ref 20–29)
Calcium: 10.5 mg/dL — AB (ref 8.7–10.2)
Chloride: 101 mmol/L (ref 96–106)
Creatinine, Ser: 1.08 mg/dL (ref 0.76–1.27)
Globulin, Total: 2.9 g/dL (ref 1.5–4.5)
Glucose: 81 mg/dL (ref 70–99)
Potassium: 4 mmol/L (ref 3.5–5.2)
Sodium: 136 mmol/L (ref 134–144)
Total Protein: 7.4 g/dL (ref 6.0–8.5)
eGFR: 83 mL/min/1.73 (ref >59)

## 2024-05-20 LAB — LIPID PANEL
Cholesterol, Total: 209 mg/dL — AB (ref 100–199)
HDL: 71 mg/dL (ref >39)
LDL CALC COMMENT:: 2.9 ratio (ref 0.0–5.0)
LDL Chol Calc (NIH): 114 mg/dL — AB (ref 0–99)
Triglycerides: 136 mg/dL (ref 0–149)
VLDL Cholesterol Cal: 24 mg/dL (ref 5–40)

## 2024-05-20 LAB — TESTOSTERONE: Testosterone: 416 ng/dL (ref 264–916)

## 2024-05-20 LAB — CBC
Hematocrit: 48.6 % (ref 37.5–51.0)
Hemoglobin: 16.2 g/dL (ref 13.0–17.7)
MCH: 31.3 pg (ref 26.6–33.0)
MCHC: 33.3 g/dL (ref 31.5–35.7)
MCV: 94 fL (ref 79–97)
Platelets: 308 x10E3/uL (ref 150–450)
RBC: 5.17 x10E6/uL (ref 4.14–5.80)
RDW: 12.8 % (ref 11.6–15.4)
WBC: 6.1 x10E3/uL (ref 3.4–10.8)

## 2024-05-20 LAB — HEMOGLOBIN A1C
Est. average glucose Bld gHb Est-mCnc: 105 mg/dL
Hgb A1c MFr Bld: 5.3 % (ref 4.8–5.6)

## 2024-05-20 LAB — VITAMIN D 25 HYDROXY (VIT D DEFICIENCY, FRACTURES): Vit D, 25-Hydroxy: 22.8 ng/mL — AB (ref 30.0–100.0)

## 2024-05-20 LAB — PSA: Prostate Specific Ag, Serum: 1.9 ng/mL (ref 0.0–4.0)

## 2024-05-22 ENCOUNTER — Other Ambulatory Visit: Payer: Self-pay | Admitting: Medical

## 2024-05-22 ENCOUNTER — Ambulatory Visit: Payer: Self-pay | Admitting: Medical

## 2024-05-22 MED ORDER — VITAMIN D (ERGOCALCIFEROL) 1.25 MG (50000 UNIT) PO CAPS: 50000.0000 [IU] | ORAL_CAPSULE | ORAL | 3 refills | Status: AC

## 2024-05-22 NOTE — Progress Notes (Signed)
 Results thru my chart

## 2024-06-13 ENCOUNTER — Encounter: Payer: Self-pay | Admitting: Radiology

## 2024-06-22 ENCOUNTER — Ambulatory Visit: Admitting: Orthopaedic Surgery

## 2024-07-11 ENCOUNTER — Other Ambulatory Visit: Payer: Self-pay

## 2024-07-11 DIAGNOSIS — G8929 Other chronic pain: Secondary | ICD-10-CM

## 2024-07-12 ENCOUNTER — Ambulatory Visit (INDEPENDENT_AMBULATORY_CARE_PROVIDER_SITE_OTHER): Admitting: Orthopaedic Surgery

## 2024-07-12 DIAGNOSIS — M1712 Unilateral primary osteoarthritis, left knee: Secondary | ICD-10-CM

## 2024-07-12 DIAGNOSIS — M170 Bilateral primary osteoarthritis of knee: Secondary | ICD-10-CM | POA: Diagnosis not present

## 2024-07-12 DIAGNOSIS — M1711 Unilateral primary osteoarthritis, right knee: Secondary | ICD-10-CM

## 2024-07-12 MED ORDER — BUPIVACAINE HCL 0.5 % IJ SOLN
2.0000 mL | INTRAMUSCULAR | Status: AC | PRN
  Administered 2024-07-12: 2 mL via INTRA_ARTICULAR

## 2024-07-12 MED ORDER — LIDOCAINE HCL 1 % IJ SOLN
2.0000 mL | INTRAMUSCULAR | Status: AC | PRN
  Administered 2024-07-12: 2 mL

## 2024-07-12 MED ORDER — METHYLPREDNISOLONE ACETATE 40 MG/ML IJ SUSP
40.0000 mg | INTRAMUSCULAR | Status: AC | PRN
  Administered 2024-07-12: 40 mg via INTRA_ARTICULAR

## 2024-07-12 NOTE — Progress Notes (Signed)
 Office Visit Note   Patient: Tony Waller           Date of Birth: 24-Apr-1971           MRN: [id number] Visit Date: 07/12/2024              Requested by: Bulah Alm RAMAN, PA-C [address] [place],  [place] [address] PCP: Bulah Alm RAMAN, PA-C   Assessment & Plan: Visit Diagnoses:  1. Primary osteoarthritis of left knee   2. Primary osteoarthritis of right knee     Plan: History of Present Illness Tony Waller is a 53 year old male who presents with bilateral knee pain for cortisone injections.  He describes several weeks of significant anterior knee pain, feeling like rocks behind the patella. Pain is worsened by going down stairs and limits daily activities.  He has previously received gel injections in the knee.  Bilateral knee exams are unchanged from prior visit.  Assessment and Plan Bilateral primary osteoarthritis of knee Chronic bilateral knee pain due to primary osteoarthritis with significant discomfort. - Administered cortisone injections to both knees. - Visco supplementation prior authorization has been submitted.  Follow-Up Instructions: No follow-ups on file.   Orders:  Orders Placed This Encounter  Procedures   Large Joint Inj   No orders of the defined types were placed in this encounter.     Procedures: Large Joint Inj: bilateral knee on 07/12/2024 12:20 PM Indications: pain Details: 22 G needle  Arthrogram: No  Medications (Right): 2 mL lidocaine  1 %; 2 mL bupivacaine  0.5 %; 40 mg methylPREDNISolone  acetate 40 MG/ML Medications (Left): 2 mL lidocaine  1 %; 2 mL bupivacaine  0.5 %; 40 mg methylPREDNISolone  acetate 40 MG/ML Outcome: tolerated well, no immediate complications Patient was prepped and draped in the usual sterile fashion.       Clinical Data: No additional findings.   Subjective: Chief Complaint  Patient presents with   Right Knee - Pain   Left Knee - Pain    HPI  Review of Systems  Constitutional: Negative.    HENT: Negative.    Eyes: Negative.   Respiratory: Negative.    Cardiovascular: Negative.   Gastrointestinal: Negative.   Endocrine: Negative.   Genitourinary: Negative.   Skin: Negative.   Allergic/Immunologic: Negative.   Neurological: Negative.   Hematological: Negative.   Psychiatric/Behavioral: Negative.    All other systems reviewed and are negative.    Objective: Vital Signs: There were no vitals taken for this visit.  Physical Exam Vitals and nursing note reviewed.  Constitutional:      Appearance: He is well-developed.  HENT:     Head: Normocephalic and atraumatic.  Eyes:     Pupils: Pupils are equal, round, and reactive to light.  Pulmonary:     Effort: Pulmonary effort is normal.  Abdominal:     Palpations: Abdomen is soft.  Musculoskeletal:        General: Normal range of motion.     Cervical back: Neck supple.  Skin:    General: Skin is warm.  Neurological:     Mental Status: He is alert and oriented to person, place, and time.  Psychiatric:        Behavior: Behavior normal.        Thought Content: Thought content normal.        Judgment: Judgment normal.     Ortho Exam  Specialty Comments:  No specialty comments available.  Imaging: No results found.   PMFS History: Patient Active  Problem List   Diagnosis Date Noted   Anxiety and depression 05/11/2024   Trochanteric bursitis, left hip 12/23/2023   Vitamin D  deficiency 05/18/2023   Essential hypertension, benign 02/24/2022   Anxiety 11/13/2021   Impaired fasting blood sugar 02/20/2021   Encounter for health maintenance examination in adult 02/15/2021   Vitamin deficiency 02/15/2021   High risk medication use 02/15/2021   Prostate enlargement 02/15/2021   Screening for heart disease 02/15/2021   Screening for diabetes mellitus 02/15/2021   Screening for prostate cancer 02/15/2021   Screening for thyroid  disorder 02/15/2021   Vaccine counseling 02/15/2021   Osteoarthritis of both  knees 11/01/2020   OSA (obstructive sleep apnea) 11/01/2020   Screen for colon cancer 11/01/2020   Arthritis of left acromioclavicular joint 08/21/2020   Acute medial meniscus tear of left knee 04/13/2019   Stress fracture of left tibia 04/13/2019   Allergic rhinitis 05/28/2009   Past Medical History:  Diagnosis Date   ALLERGIC RHINITIS    Arthralgia    knees, shoulder   Arthritis    OSA (obstructive sleep apnea) 2011   on CPAP most nights   Wears contact lenses     Family History  Problem Relation Age of Onset   Allergies Mother    Diabetes Mother    Arthritis Mother    Hyperlipidemia Father    Hypertension Father    Arthritis Brother    Brain cancer Maternal Uncle    Stroke Maternal Grandmother    Heart disease Maternal Grandfather    Heart disease Paternal Grandmother    Lung cancer Paternal Grandfather    Cancer Paternal Grandfather    Colon cancer Neg Hx    Colon polyps Neg Hx    Rectal cancer Neg Hx    Stomach cancer Neg Hx    Esophageal cancer Neg Hx     Past Surgical History:  Procedure Laterality Date   BUNIONECTOMY Left    KNEE ARTHROSCOPY WITH SUBCHONDROPLASTY Left 06/22/2019   Procedure: LEFT KNEE ARTHROSCOPY WITH PARTIAL MEDIAL MENISCECTOMY, SUBCHONDROPLASTY AND EXCISION OF CYST;  Surgeon: Jerri Kay HERO, MD;  Location: [facility];  Service: Orthopedics;  Laterality: Left;   NASAL SINUS SURGERY  2000   for deviated septum and turbonate reduction   ROTATOR CUFF REPAIR     right   TONSILLECTOMY  1994   Social History   Occupational History   Occupation: Psychologist, 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

## 2024-07-16 ENCOUNTER — Other Ambulatory Visit: Payer: Self-pay | Admitting: Medical

## 2024-08-05 DIAGNOSIS — M25561 Pain in right knee: Secondary | ICD-10-CM | POA: Diagnosis not present

## 2024-08-05 DIAGNOSIS — M25461 Effusion, right knee: Secondary | ICD-10-CM | POA: Diagnosis not present

## 2024-08-15 ENCOUNTER — Other Ambulatory Visit: Payer: Self-pay | Admitting: Medical

## 2024-08-15 ENCOUNTER — Ambulatory Visit (INDEPENDENT_AMBULATORY_CARE_PROVIDER_SITE_OTHER): Admitting: Physician Assistant

## 2024-08-15 DIAGNOSIS — M1711 Unilateral primary osteoarthritis, right knee: Secondary | ICD-10-CM | POA: Diagnosis not present

## 2024-08-15 MED ORDER — BUPIVACAINE HCL 0.25 % IJ SOLN
2.0000 mL | INTRAMUSCULAR | Status: AC | PRN
  Administered 2024-08-15: 2 mL via INTRA_ARTICULAR

## 2024-08-15 MED ORDER — TRAMADOL HCL 50 MG PO TABS: 50.0000 mg | ORAL_TABLET | Freq: Two times a day (BID) | ORAL | 1 refills | Status: AC | PRN

## 2024-08-15 MED ORDER — METHYLPREDNISOLONE ACETATE 40 MG/ML IJ SUSP
40.0000 mg | INTRAMUSCULAR | Status: AC | PRN
  Administered 2024-08-15: 40 mg via INTRA_ARTICULAR

## 2024-08-15 MED ORDER — LIDOCAINE HCL 1 % IJ SOLN
2.0000 mL | INTRAMUSCULAR | Status: AC | PRN
  Administered 2024-08-15: 2 mL

## 2024-08-15 NOTE — Progress Notes (Signed)
 "  Office Visit Note   Patient: Tony Waller           Date of Birth: 1970-09-30           MRN: [id number] Visit Date: 08/15/2024              Requested by: Bulah Alm RAMAN, PA-C [address] [place],  [place] [address] PCP: Bulah Alm RAMAN, PA-C   Assessment & Plan: Visit Diagnoses:  1. Primary osteoarthritis of right knee     Plan: Impression is acute right knee injury with underlying advanced osteoarthritis of the right knee.  At this point, x-rays are unremarkable for acute fracture.  I did aspirate the right knee and obtained approximately 40 cc of blood-tinged synovial fluid.  This was not concerning for occult fracture.  I then injected the right knee with cortisone.  Have encouraged him to ice and elevate and back off activity as needed.  Sent in tramadol  to take as needed.  Follow-up with us  as needed.  Call with concerns or questions.  Follow-Up Instructions: Return if symptoms worsen or fail to improve.   Orders:  No orders of the defined types were placed in this encounter.  Meds ordered this encounter  Medications   traMADol  (ULTRAM ) 50 MG tablet    Sig: Take 1 tablet (50 mg total) by mouth 2 (two) times daily as needed.    Dispense:  30 tablet    Refill:  1      Procedures: Large Joint Inj: R knee on 08/15/2024 10:30 AM Indications: pain Details: 22 G needle, anterolateral approach Medications: 2 mL lidocaine  1 %; 2 mL bupivacaine  0.25 %; 40 mg methylPREDNISolone  acetate 40 MG/ML      Clinical Data: No additional findings.   Subjective: Chief Complaint  Patient presents with   Right Knee - Pain    HPI patient is a pleasant 54 year old gentleman who comes in today with acute on chronic right knee pain.  History of underlying advanced arthritis to the right knee.  He was last seen and injected by us  with cortisone in the right knee on 07/12/2024.  He was having good relief of his knee pain until he slipped on grease and fell on December 23.  2 days  later he noticed increased pain and swelling to the right knee to the point where he was unable to bear weight or stair climb.  He was seen in urgent care setting where x-rays were negative for fracture.  He is also noting locking and catching.  All of this pain is to the medial aspect.  He is not taking any pain medicine for this.  Review of Systems as detailed in HPI.  All others reviewed and are negative.   Objective: Vital Signs: There were no vitals taken for this visit.  Physical Exam well-developed and well-nourished gentleman in no acute distress.  Alert and orient x 3.  Ortho Exam right knee exam: Moderate knee effusion.  Range of motion 0 to 95 degrees.  Moderate medial joint line tenderness.  Mild lateral joint line tenderness.  He is able to fully straight leg raise.  He is neurovascular intact distally.  Specialty Comments:  No specialty comments available.  Imaging: No new imaging   PMFS History: Patient Active Problem List   Diagnosis Date Noted   Anxiety and depression 05/11/2024   Trochanteric bursitis, left hip 12/23/2023   Vitamin D  deficiency 05/18/2023   Essential hypertension, benign 02/24/2022   Anxiety 11/13/2021  Impaired fasting blood sugar 02/20/2021   Encounter for health maintenance examination in adult 02/15/2021   Vitamin deficiency 02/15/2021   High risk medication use 02/15/2021   Prostate enlargement 02/15/2021   Screening for heart disease 02/15/2021   Screening for diabetes mellitus 02/15/2021   Screening for prostate cancer 02/15/2021   Screening for thyroid  disorder 02/15/2021   Vaccine counseling 02/15/2021   Osteoarthritis of both knees 11/01/2020   OSA (obstructive sleep apnea) 11/01/2020   Screen for colon cancer 11/01/2020   Arthritis of left acromioclavicular joint 08/21/2020   Acute medial meniscus tear of left knee 04/13/2019   Stress fracture of left tibia 04/13/2019   Allergic rhinitis 05/28/2009   Past Medical History:   Diagnosis Date   ALLERGIC RHINITIS    Arthralgia    knees, shoulder   Arthritis    OSA (obstructive sleep apnea) 2011   on CPAP most nights   Wears contact lenses     Family History  Problem Relation Age of Onset   Allergies Mother    Diabetes Mother    Arthritis Mother    Hyperlipidemia Father    Hypertension Father    Arthritis Brother    Brain cancer Maternal Uncle    Stroke Maternal Grandmother    Heart disease Maternal Grandfather    Heart disease Paternal Grandmother    Lung cancer Paternal Grandfather    Cancer Paternal Grandfather    Colon cancer Neg Hx    Colon polyps Neg Hx    Rectal cancer Neg Hx    Stomach cancer Neg Hx    Esophageal cancer Neg Hx     Past Surgical History:  Procedure Laterality Date   BUNIONECTOMY Left    KNEE ARTHROSCOPY WITH SUBCHONDROPLASTY Left 06/22/2019   Procedure: LEFT KNEE ARTHROSCOPY WITH PARTIAL MEDIAL MENISCECTOMY, SUBCHONDROPLASTY AND EXCISION OF CYST;  Surgeon: Jerri Kay HERO, MD;  Location: [facility];  Service: Orthopedics;  Laterality: Left;   NASAL SINUS SURGERY  2000   for deviated septum and turbonate reduction   ROTATOR CUFF REPAIR     right   TONSILLECTOMY  1994   Social History   Occupational History   Occupation: Psychologist, 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

## 2024-08-19 ENCOUNTER — Encounter: Payer: Self-pay | Admitting: Orthopaedic Surgery

## 2024-08-24 ENCOUNTER — Other Ambulatory Visit: Payer: Self-pay | Admitting: Physician Assistant

## 2024-08-24 ENCOUNTER — Ambulatory Visit: Admitting: Orthopaedic Surgery

## 2024-08-24 DIAGNOSIS — M25561 Pain in right knee: Secondary | ICD-10-CM

## 2024-08-24 DIAGNOSIS — G8929 Other chronic pain: Secondary | ICD-10-CM | POA: Diagnosis not present

## 2024-08-24 NOTE — Progress Notes (Addendum)
 "  Office Visit Note   Patient: Tony Waller           Date of Birth: 11-21-1970           MRN: [id number] Visit Date: 08/24/2024              Requested by: Bulah Alm RAMAN, PA-C [address] [place],  [place] [address] PCP: Bulah Alm RAMAN, PA-C   Assessment & Plan: Visit Diagnoses:  1. Chronic pain of right knee     Plan: History of Present Illness Tony Waller is a 54 year old male with right knee osteoarthritis who presents with recurrent right knee pain and effusion following a twisting injury.  He slipped on grease at home on 08/02/2024, causing a twisting injury with knee buckling but no audible pop. Pain began immediately, and by 08/04/2024 he developed marked swelling with inability to bear weight or ambulate.  He was seen at urgent care on 08/05/2024, where left knee x-rays were obtained and he was started on prednisone and icing.  Last week about 40 cc of synovial fluid was aspirated from the right knee and a steroid injection was given. Effusion has since recurred with ongoing swelling and difficulty walking, especially with stairs and turning.  He feels this pain is different from his usual arthritis flares, with persistent swelling since the injury and pain that migrates around the knee, including posterior and lateral regions. He denies pain elsewhere. He has had prior left knee arthroscopy.  Physical Exam MUSCULOSKELETAL: Large effusion in the right knee.  Assessment and Plan Right knee pain and effusion following injury with underlying osteoarthritis Recurrent effusion and pain post-injury suggest intra-articular pathology superimposed on chronic osteoarthritis. Differential includes meniscal tear, cartilage injury, bone bruise, fracture, or loose body. Prior arthroscopy and osteoarthritis complicate assessment. - Ordered right knee MRI to evaluate for fracture, meniscal tear, cartilage injury, bone bruise, or loose body.  Follow-Up Instructions: No follow-ups on  file.   Orders:  Orders Placed This Encounter  Procedures   MR Knee Right w/o contrast   No orders of the defined types were placed in this encounter.     Procedures: No procedures performed   Clinical Data: No additional findings.   Subjective: Chief Complaint  Patient presents with   Right Knee - Pain    HPI  Review of Systems  Constitutional: Negative.   HENT: Negative.    Eyes: Negative.   Respiratory: Negative.    Cardiovascular: Negative.   Gastrointestinal: Negative.   Endocrine: Negative.   Genitourinary: Negative.   Skin: Negative.   Allergic/Immunologic: Negative.   Neurological: Negative.   Hematological: Negative.   Psychiatric/Behavioral: Negative.    All other systems reviewed and are negative.    Objective: Vital Signs: There were no vitals taken for this visit.  Physical Exam Vitals and nursing note reviewed.  Constitutional:      Appearance: He is well-developed.  Pulmonary:     Effort: Pulmonary effort is normal.  Abdominal:     Palpations: Abdomen is soft.  Skin:    General: Skin is warm.  Neurological:     Mental Status: He is alert and oriented to person, place, and time.  Psychiatric:        Behavior: Behavior normal.        Thought Content: Thought content normal.        Judgment: Judgment normal.     Ortho Exam  Specialty Comments:  No specialty comments available.  Imaging: No results found.  PMFS History: Patient Active Problem List   Diagnosis Date Noted   Anxiety and depression 05/11/2024   Trochanteric bursitis, left hip 12/23/2023   Vitamin D  deficiency 05/18/2023   Essential hypertension, benign 02/24/2022   Anxiety 11/13/2021   Impaired fasting blood sugar 02/20/2021   Encounter for health maintenance examination in adult 02/15/2021   Vitamin deficiency 02/15/2021   High risk medication use 02/15/2021   Prostate enlargement 02/15/2021   Screening for heart disease 02/15/2021   Screening for  diabetes mellitus 02/15/2021   Screening for prostate cancer 02/15/2021   Screening for thyroid  disorder 02/15/2021   Vaccine counseling 02/15/2021   Osteoarthritis of both knees 11/01/2020   OSA (obstructive sleep apnea) 11/01/2020   Screen for colon cancer 11/01/2020   Arthritis of left acromioclavicular joint 08/21/2020   Acute medial meniscus tear of left knee 04/13/2019   Stress fracture of left tibia 04/13/2019   Allergic rhinitis 05/28/2009   Past Medical History:  Diagnosis Date   ALLERGIC RHINITIS    Arthralgia    knees, shoulder   Arthritis    OSA (obstructive sleep apnea) 2011   on CPAP most nights   Wears contact lenses     Family History  Problem Relation Age of Onset   Allergies Mother    Diabetes Mother    Arthritis Mother    Hyperlipidemia Father    Hypertension Father    Arthritis Brother    Brain cancer Maternal Uncle    Stroke Maternal Grandmother    Heart disease Maternal Grandfather    Heart disease Paternal Grandmother    Lung cancer Paternal Grandfather    Cancer Paternal Grandfather    Colon cancer Neg Hx    Colon polyps Neg Hx    Rectal cancer Neg Hx    Stomach cancer Neg Hx    Esophageal cancer Neg Hx     Past Surgical History:  Procedure Laterality Date   BUNIONECTOMY Left    KNEE ARTHROSCOPY WITH SUBCHONDROPLASTY Left 06/22/2019   Procedure: LEFT KNEE ARTHROSCOPY WITH PARTIAL MEDIAL MENISCECTOMY, SUBCHONDROPLASTY AND EXCISION OF CYST;  Surgeon: Jerri Kay HERO, MD;  Location: [facility];  Service: Orthopedics;  Laterality: Left;   NASAL SINUS SURGERY  2000   for deviated septum and turbonate reduction   ROTATOR CUFF REPAIR     right   TONSILLECTOMY  1994   Social History   Occupational History   Occupation: Psychologist, 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

## 2024-08-25 ENCOUNTER — Encounter: Payer: Self-pay | Admitting: Orthopaedic Surgery

## 2024-08-25 ENCOUNTER — Other Ambulatory Visit: Payer: Self-pay | Admitting: Orthopaedic Surgery

## 2024-08-26 NOTE — Telephone Encounter (Signed)
 I have changed it to the right knee.

## 2024-08-26 NOTE — Telephone Encounter (Signed)
 This has been approved  [facility] has attempted to call pt to schedule.   Will send Mychart message to let him know to call imaging to schedule.

## 2024-08-27 ENCOUNTER — Ambulatory Visit
Admission: RE | Admit: 2024-08-27 | Discharge: 2024-08-27 | Disposition: A | Source: Ambulatory Visit | Attending: Orthopaedic Surgery | Admitting: Orthopaedic Surgery

## 2024-08-27 DIAGNOSIS — G8929 Other chronic pain: Secondary | ICD-10-CM

## 2024-08-28 ENCOUNTER — Ambulatory Visit: Payer: Self-pay | Admitting: Orthopaedic Surgery

## 2024-08-28 NOTE — Progress Notes (Signed)
 Needs f/u appt

## 2024-08-29 NOTE — Progress Notes (Signed)
 Called and scheduled

## 2024-08-31 ENCOUNTER — Ambulatory Visit: Admitting: Orthopaedic Surgery

## 2024-09-01 ENCOUNTER — Ambulatory Visit: Admitting: Orthopaedic Surgery

## 2024-09-01 DIAGNOSIS — M25561 Pain in right knee: Secondary | ICD-10-CM

## 2024-09-01 DIAGNOSIS — G8929 Other chronic pain: Secondary | ICD-10-CM | POA: Diagnosis not present

## 2024-09-01 MED ORDER — MELOXICAM 15 MG PO TABS: 15.0000 mg | ORAL_TABLET | Freq: Every day | ORAL | 0 refills | Status: AC

## 2024-09-01 NOTE — Progress Notes (Signed)
 "  Office Visit Note   Patient: Tony Waller           Date of Birth: [birth year]           MRN: [id number] Visit Date: 09/01/2024              Requested by: Tony Alm RAMAN, PA-C [address] [place],  [place] [address] PCP: Tony Alm RAMAN, PA-C   Assessment & Plan: Visit Diagnoses:  1. Chronic pain of right knee     Plan: Impression is exacerbation of pre-existing severe DJD.  Synovitis and large effusion present.  Chronic large tear of the medial meniscus.  Based on findings I have recommended symptomatic treatment.  Nothing overtly surgical acutely.  He understands that down the road he is facing a knee replacement.  I will send in prescription for meloxicam .  He will take it easy until things feel better.  Compression knee sleeve recommended.  Follow-up as needed.  Follow-Up Instructions: Return if symptoms worsen or fail to improve.   Orders:  No orders of the defined types were placed in this encounter.  Meds ordered this encounter  Medications   meloxicam  (MOBIC ) 15 MG tablet    Sig: Take 1 tablet (15 mg total) by mouth daily.    Dispense:  30 tablet    Refill:  0      Procedures: No procedures performed   Clinical Data: No additional findings.   Subjective: Chief Complaint  Patient presents with   Right Knee - Pain    MRI REVIEW    HPI Right knee returns today to discuss right knee MRI scan.  He feels a little bit better and he is off crutches now. Review of Systems  Constitutional: Negative.   HENT: Negative.    Eyes: Negative.   Respiratory: Negative.    Cardiovascular: Negative.   Gastrointestinal: Negative.   Endocrine: Negative.   Genitourinary: Negative.   Skin: Negative.   Allergic/Immunologic: Negative.   Neurological: Negative.   Hematological: Negative.   Psychiatric/Behavioral: Negative.    All other systems reviewed and are negative.    Objective: Vital Signs: There were no vitals taken for this visit.  Physical  Exam Vitals and nursing note reviewed.  Constitutional:      Appearance: He is well-developed.  HENT:     Head: Normocephalic and atraumatic.  Eyes:     Pupils: Pupils are equal, round, and reactive to light.  Pulmonary:     Effort: Pulmonary effort is normal.  Abdominal:     Palpations: Abdomen is soft.  Musculoskeletal:        General: Normal range of motion.     Cervical back: Neck supple.  Skin:    General: Skin is warm.  Neurological:     Mental Status: He is alert and oriented to person, place, and time.  Psychiatric:        Behavior: Behavior normal.        Thought Content: Thought content normal.        Judgment: Judgment normal.     Ortho Exam Exam of the right knee shows small effusion.  Range of motion is progressing.  Pain has improved. Specialty Comments:  No specialty comments available.  Imaging: No results found.   PMFS History: Patient Active Problem List   Diagnosis Date Noted   Anxiety and depression 05/11/2024   Trochanteric bursitis, left hip 12/23/2023   Vitamin D  deficiency 05/18/2023   Essential hypertension, benign 02/24/2022   Anxiety 11/13/2021  Impaired fasting blood sugar 02/20/2021   Encounter for health maintenance examination in adult 02/15/2021   Vitamin deficiency 02/15/2021   High risk medication use 02/15/2021   Prostate enlargement 02/15/2021   Screening for heart disease 02/15/2021   Screening for diabetes mellitus 02/15/2021   Screening for prostate cancer 02/15/2021   Screening for thyroid  disorder 02/15/2021   Vaccine counseling 02/15/2021   Osteoarthritis of both knees 11/01/2020   OSA (obstructive sleep apnea) 11/01/2020   Screen for colon cancer 11/01/2020   Arthritis of left acromioclavicular joint 08/21/2020   Acute medial meniscus tear of left knee 04/13/2019   Stress fracture of left tibia 04/13/2019   Allergic rhinitis 05/28/2009   Past Medical History:  Diagnosis Date   ALLERGIC RHINITIS    Arthralgia     knees, shoulder   Arthritis    OSA (obstructive sleep apnea) 2011   on CPAP most nights   Wears contact lenses     Family History  Problem Relation Age of Onset   Allergies Mother    Diabetes Mother    Arthritis Mother    Hyperlipidemia Father    Hypertension Father    Arthritis Brother    Brain cancer Maternal Uncle    Stroke Maternal Grandmother    Heart disease Maternal Grandfather    Heart disease Paternal Grandmother    Lung cancer Paternal Grandfather    Cancer Paternal Grandfather    Colon cancer Neg Hx    Colon polyps Neg Hx    Rectal cancer Neg Hx    Stomach cancer Neg Hx    Esophageal cancer Neg Hx     Past Surgical History:  Procedure Laterality Date   BUNIONECTOMY Left    KNEE ARTHROSCOPY WITH SUBCHONDROPLASTY Left 06/22/2019   Procedure: LEFT KNEE ARTHROSCOPY WITH PARTIAL MEDIAL MENISCECTOMY, SUBCHONDROPLASTY AND EXCISION OF CYST;  Surgeon: Jerri Kay HERO, MD;  Location: [facility];  Service: Orthopedics;  Laterality: Left;   NASAL SINUS SURGERY  2000   for deviated septum and turbonate reduction   ROTATOR CUFF REPAIR     right   TONSILLECTOMY  1994   Social History   Occupational History   Occupation: Psychologist, 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

## 2024-09-06 ENCOUNTER — Ambulatory Visit: Admitting: Orthopaedic Surgery
